# Patient Record
Sex: Male | Born: 1970 | Race: White | Hispanic: No | Marital: Married | Smoking: Never smoker
Health system: Southern US, Community
[De-identification: ages and names within clinical notes are randomized; demographics above are authoritative.]

## PROBLEM LIST (undated history)

## (undated) DIAGNOSIS — M51369 Other intervertebral disc degeneration, lumbar region without mention of lumbar back pain or lower extremity pain: Secondary | ICD-10-CM

## (undated) DIAGNOSIS — E785 Hyperlipidemia, unspecified: Secondary | ICD-10-CM

## (undated) DIAGNOSIS — M5136 Other intervertebral disc degeneration, lumbar region: Secondary | ICD-10-CM

## (undated) DIAGNOSIS — E119 Type 2 diabetes mellitus without complications: Secondary | ICD-10-CM

## (undated) DIAGNOSIS — F419 Anxiety disorder, unspecified: Secondary | ICD-10-CM

## (undated) DIAGNOSIS — G8929 Other chronic pain: Secondary | ICD-10-CM

## (undated) DIAGNOSIS — I10 Essential (primary) hypertension: Secondary | ICD-10-CM

## (undated) DIAGNOSIS — I739 Peripheral vascular disease, unspecified: Secondary | ICD-10-CM

## (undated) DIAGNOSIS — F32A Depression, unspecified: Secondary | ICD-10-CM

## (undated) HISTORY — PX: MEDIAL COLLATERAL LIGAMENT REPAIR, KNEE: SHX2019

## (undated) HISTORY — PX: HERNIA REPAIR: SHX51

## (undated) HISTORY — DX: Peripheral vascular disease, unspecified: I73.9

---

## 2017-12-21 ENCOUNTER — Other Ambulatory Visit: Payer: Self-pay | Admitting: Neurosurgery

## 2017-12-21 DIAGNOSIS — G8929 Other chronic pain: Secondary | ICD-10-CM

## 2017-12-21 DIAGNOSIS — M5442 Lumbago with sciatica, left side: Principal | ICD-10-CM

## 2017-12-27 ENCOUNTER — Ambulatory Visit
Admission: RE | Admit: 2017-12-27 | Discharge: 2017-12-27 | Disposition: A | Discharge status: Home / Self Care | Payer: Managed Care, Other (non HMO) | Source: Ambulatory Visit | Attending: Neurosurgery | Admitting: Neurosurgery

## 2017-12-27 DIAGNOSIS — M5442 Lumbago with sciatica, left side: Principal | ICD-10-CM

## 2017-12-27 DIAGNOSIS — G8929 Other chronic pain: Secondary | ICD-10-CM

## 2017-12-27 MED ORDER — IOPAMIDOL (ISOVUE-M 200) INJECTION 41%
1.0000 mL | Freq: Once | INTRAMUSCULAR | Status: AC
  Administered 2017-12-27: 1 mL via EPIDURAL

## 2017-12-27 MED ORDER — METHYLPREDNISOLONE ACETATE 40 MG/ML INJ SUSP (RADIOLOG
120.0000 mg | Freq: Once | INTRAMUSCULAR | Status: AC
  Administered 2017-12-27: 120 mg via EPIDURAL

## 2017-12-27 NOTE — Discharge Instructions (Signed)

## 2018-07-12 ENCOUNTER — Other Ambulatory Visit: Payer: Self-pay | Admitting: Physician Assistant

## 2018-07-12 DIAGNOSIS — M5442 Lumbago with sciatica, left side: Secondary | ICD-10-CM

## 2018-07-27 ENCOUNTER — Ambulatory Visit
Admission: RE | Admit: 2018-07-27 | Discharge: 2018-07-27 | Disposition: A | Discharge status: Home / Self Care | Payer: Managed Care, Other (non HMO) | Source: Ambulatory Visit | Attending: Physician Assistant | Admitting: Physician Assistant

## 2018-07-27 DIAGNOSIS — M5442 Lumbago with sciatica, left side: Secondary | ICD-10-CM

## 2018-07-27 MED ORDER — METHYLPREDNISOLONE ACETATE 40 MG/ML INJ SUSP (RADIOLOG
120.0000 mg | Freq: Once | INTRAMUSCULAR | Status: AC
  Administered 2018-07-27: 120 mg via EPIDURAL

## 2018-07-27 MED ORDER — IOPAMIDOL (ISOVUE-M 200) INJECTION 41%
1.0000 mL | Freq: Once | INTRAMUSCULAR | Status: AC
  Administered 2018-07-27: 1 mL via EPIDURAL

## 2018-07-27 NOTE — Discharge Instructions (Signed)

## 2018-12-20 ENCOUNTER — Other Ambulatory Visit: Payer: Self-pay | Admitting: Physician Assistant

## 2018-12-20 DIAGNOSIS — M5442 Lumbago with sciatica, left side: Secondary | ICD-10-CM

## 2018-12-27 ENCOUNTER — Ambulatory Visit
Admission: RE | Admit: 2018-12-27 | Discharge: 2018-12-27 | Disposition: A | Discharge status: Home / Self Care | Payer: 59 | Source: Ambulatory Visit | Attending: Physician Assistant | Admitting: Physician Assistant

## 2018-12-27 DIAGNOSIS — M5442 Lumbago with sciatica, left side: Secondary | ICD-10-CM

## 2018-12-27 MED ORDER — IOPAMIDOL (ISOVUE-M 200) INJECTION 41%
1.0000 mL | Freq: Once | INTRAMUSCULAR | Status: AC
  Administered 2018-12-27: 1 mL via EPIDURAL

## 2018-12-27 MED ORDER — METHYLPREDNISOLONE ACETATE 40 MG/ML INJ SUSP (RADIOLOG
120.0000 mg | Freq: Once | INTRAMUSCULAR | Status: AC
  Administered 2018-12-27: 120 mg via EPIDURAL

## 2019-10-20 IMAGING — XA Imaging study
1 series · 1 of 1 positions shown · non-contrast
Comparison: none

CLINICAL DATA: Left low back pain. Lumbosacral spondylosis without
myelopathy. Significant relief after the previous injection, without
side effect or complication. Partial recurrence of symptoms. The
patient wishes to repeat.

[Series 1: vasc standard · 1 of 1 slices shown]
[im 1/1]
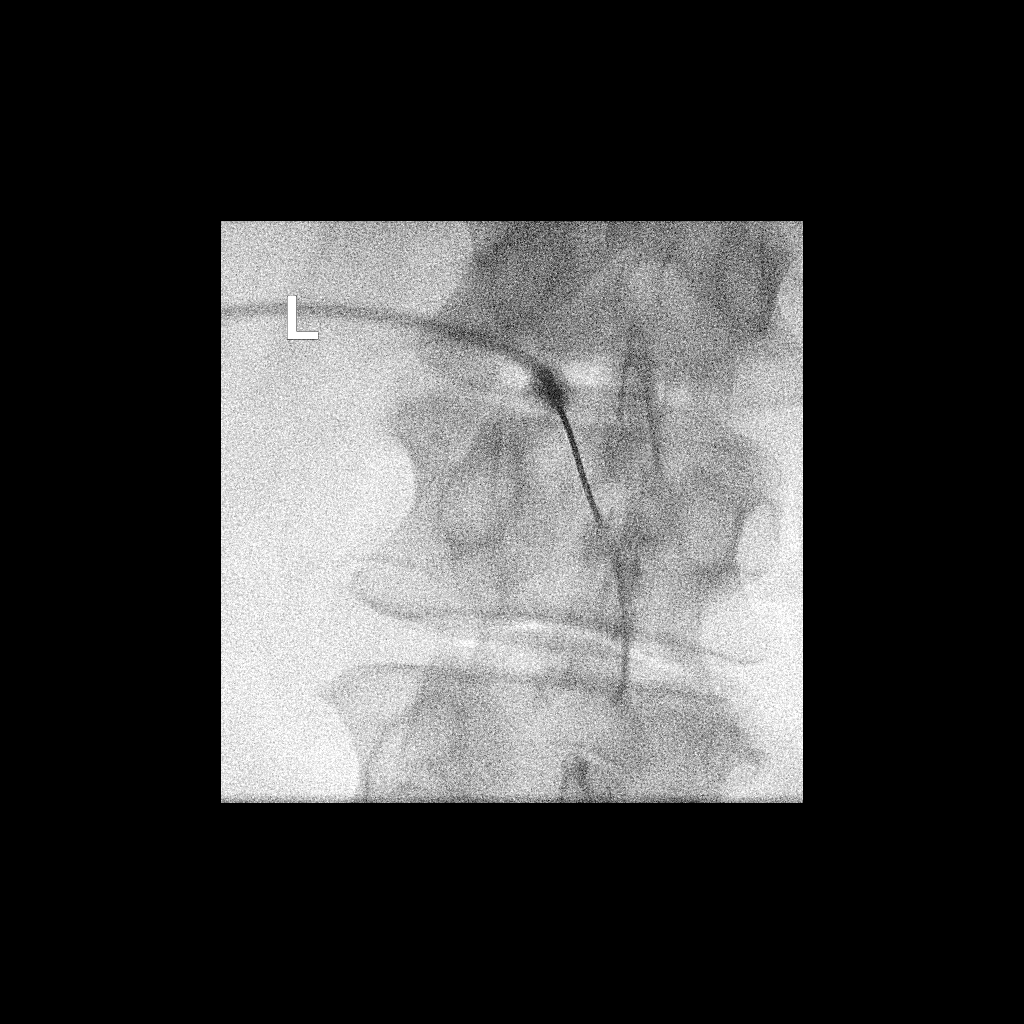

[1 of 1 positions shown; findings below may reference images not displayed]

EXAM:
LUMBAR EPIDURAL INJECTION:

DIAGNOSTIC EPIDURAL INJECTION:

THERAPEUTIC EPIDURAL INJECTION:

PROCEDURE:
The procedure, risks, benefits, and alternatives were explained to
the patient. Questions regarding the procedure were encouraged and
answered. The patient understands and consents to the procedure.

An interlaminar approach was performed on left at at L2-3. The
overlying skin was cleansed and anesthetized. A 20 gauge epidural
needle was advanced using loss-of-resistance technique.

Injection of Isovue-M 200 shows a good epidural pattern with spread
above and below the level of needle placement, primarily on the left
no vascular opacification is seen.

120mg of Depo-Medrol mixed with 2ml lidocaine 1% were instilled. The
procedure was well-tolerated, and the patient was discharged thirty
minutes following the injection in good condition.

FLUOROSCOPY TIME:  61 seconds; 88 uLymI DAP

COMPLICATIONS:
None immediate
IMPRESSION: Technically successful epidural injection # 2 on the left at at
L2-3.

## 2021-03-22 ENCOUNTER — Emergency Department (HOSPITAL_BASED_OUTPATIENT_CLINIC_OR_DEPARTMENT_OTHER)
Admission: EM | Admit: 2021-03-22 | Discharge: 2021-03-22 | Disposition: A | Discharge status: Home / Self Care | Payer: BC Managed Care – PPO | Attending: Emergency Medicine | Admitting: Emergency Medicine

## 2021-03-22 ENCOUNTER — Encounter (HOSPITAL_BASED_OUTPATIENT_CLINIC_OR_DEPARTMENT_OTHER): Payer: Self-pay | Admitting: *Deleted

## 2021-03-22 ENCOUNTER — Emergency Department (HOSPITAL_BASED_OUTPATIENT_CLINIC_OR_DEPARTMENT_OTHER): Payer: BC Managed Care – PPO

## 2021-03-22 ENCOUNTER — Other Ambulatory Visit: Payer: Self-pay

## 2021-03-22 DIAGNOSIS — I10 Essential (primary) hypertension: Secondary | ICD-10-CM | POA: Insufficient documentation

## 2021-03-22 DIAGNOSIS — R000 Tachycardia, unspecified: Secondary | ICD-10-CM | POA: Diagnosis not present

## 2021-03-22 DIAGNOSIS — R1032 Left lower quadrant pain: Secondary | ICD-10-CM | POA: Insufficient documentation

## 2021-03-22 DIAGNOSIS — D72829 Elevated white blood cell count, unspecified: Secondary | ICD-10-CM | POA: Diagnosis not present

## 2021-03-22 DIAGNOSIS — E1165 Type 2 diabetes mellitus with hyperglycemia: Secondary | ICD-10-CM | POA: Diagnosis not present

## 2021-03-22 DIAGNOSIS — K5900 Constipation, unspecified: Secondary | ICD-10-CM | POA: Insufficient documentation

## 2021-03-22 DIAGNOSIS — R739 Hyperglycemia, unspecified: Secondary | ICD-10-CM

## 2021-03-22 HISTORY — DX: Depression, unspecified: F32.A

## 2021-03-22 HISTORY — DX: Anxiety disorder, unspecified: F41.9

## 2021-03-22 HISTORY — DX: Type 2 diabetes mellitus without complications: E11.9

## 2021-03-22 HISTORY — DX: Other chronic pain: G89.29

## 2021-03-22 HISTORY — DX: Essential (primary) hypertension: I10

## 2021-03-22 HISTORY — DX: Hyperlipidemia, unspecified: E78.5

## 2021-03-22 LAB — COMPREHENSIVE METABOLIC PANEL
ALT: 18 U/L (ref 0–44)
AST: 16 U/L (ref 15–41)
Albumin: 3.8 g/dL (ref 3.5–5.0)
Alkaline Phosphatase: 93 U/L (ref 38–126)
Anion gap: 9 (ref 5–15)
BUN: 17 mg/dL (ref 6–20)
CO2: 26 mmol/L (ref 22–32)
Calcium: 9.3 mg/dL (ref 8.9–10.3)
Chloride: 93 mmol/L — ABNORMAL LOW (ref 98–111)
Creatinine, Ser: 0.63 mg/dL (ref 0.61–1.24)
GFR, Estimated: >60 mL/min (ref >60)
Glucose, Bld: 388 mg/dL — ABNORMAL HIGH (ref 70–99)
Potassium: 3.8 mmol/L (ref 3.5–5.1)
Sodium: 128 mmol/L — ABNORMAL LOW (ref 135–145)
Total Bilirubin: 0.5 mg/dL (ref 0.3–1.2)
Total Protein: 7.2 g/dL (ref 6.5–8.1)

## 2021-03-22 LAB — CBC WITH DIFFERENTIAL/PLATELET
Abs Immature Granulocytes: 0.05 10*3/uL (ref 0.00–0.07)
Basophils Absolute: 0.1 10*3/uL (ref 0.0–0.1)
Basophils Relative: 1 %
Eosinophils Absolute: 0.4 10*3/uL (ref 0.0–0.5)
Eosinophils Relative: 3 %
HCT: 46.3 % (ref 39.0–52.0)
Hemoglobin: 16 g/dL (ref 13.0–17.0)
Immature Granulocytes: 0 %
Lymphocytes Relative: 20 %
Lymphs Abs: 2.7 10*3/uL (ref 0.7–4.0)
MCH: 27.8 pg (ref 26.0–34.0)
MCHC: 34.6 g/dL (ref 30.0–36.0)
MCV: 80.5 fL (ref 80.0–100.0)
Monocytes Absolute: 1.2 10*3/uL — ABNORMAL HIGH (ref 0.1–1.0)
Monocytes Relative: 9 %
Neutro Abs: 9.3 10*3/uL — ABNORMAL HIGH (ref 1.7–7.7)
Neutrophils Relative %: 67 %
Platelets: 395 10*3/uL (ref 150–400)
RBC: 5.75 MIL/uL (ref 4.22–5.81)
RDW: 11.9 % (ref 11.5–15.5)
WBC: 13.7 10*3/uL — ABNORMAL HIGH (ref 4.0–10.5)
nRBC: 0 % (ref 0.0–0.2)

## 2021-03-22 LAB — URINALYSIS, ROUTINE W REFLEX MICROSCOPIC
Bilirubin Urine: NEGATIVE
Glucose, UA: >=500 mg/dL — AB
Hgb urine dipstick: NEGATIVE
Ketones, ur: NEGATIVE mg/dL
Leukocytes,Ua: NEGATIVE
Nitrite: NEGATIVE
Protein, ur: NEGATIVE mg/dL
Specific Gravity, Urine: 1.015 (ref 1.005–1.030)
pH: 5 (ref 5.0–8.0)

## 2021-03-22 LAB — URINALYSIS, MICROSCOPIC (REFLEX): Bacteria, UA: NONE SEEN

## 2021-03-22 LAB — LIPASE, BLOOD: Lipase: 63 U/L — ABNORMAL HIGH (ref 11–51)

## 2021-03-22 MED ORDER — SENNOSIDES-DOCUSATE SODIUM 8.6-50 MG PO TABS: 1.0000 | ORAL_TABLET | Freq: Every evening | ORAL | 0 refills | Status: DC | PRN

## 2021-03-22 MED ORDER — DICYCLOMINE HCL 10 MG PO CAPS
10.0000 mg | ORAL_CAPSULE | Freq: Once | ORAL | Status: AC
  Administered 2021-03-22: 10 mg via ORAL
  Filled 2021-03-22: qty 1

## 2021-03-22 MED ORDER — DICYCLOMINE HCL 20 MG PO TABS: 20.0000 mg | ORAL_TABLET | Freq: Three times a day (TID) | ORAL | 0 refills | Status: DC | PRN

## 2021-03-22 NOTE — ED Provider Notes (Signed)
Emergency Department Provider Note   I have reviewed the triage vital signs and the nursing notes.   HISTORY  Chief Complaint Abdominal Pain   HPI Larry Hammond is a 50 y.o. male with past medical history reviewed below including diabetes presents to the emergency department left abdomen and flank pain over the past 3 weeks.  He denies fevers but has felt hot at times.  He has some intermittent constipation but no diarrhea.  Has been feeling nauseated with no vomiting.  Denies any pain radiating into the chest or back.  He has not seen an associated rash.  He was seen at [facility] facility 3 days ago and had blood work along with a noncontrast CT scan of the abdomen and pelvis.  Lab work showed a mild leukocytosis with elevated blood sugar and CT scan showed nonobstructing kidney stone on the left but no ureterolithiasis.  No diverticulitis.   Patient's continued to have pain symptoms which have not changed but not improved.  He has been having a lot of gas.  No blood in the bowel movements.  No rectal pain.  No testicular pain, dysuria, hesitancy, urgency.  Past Medical History:  Diagnosis Date  . Anxiety   . Chronic back pain   . Depression   . Diabetes mellitus without complication (HCC)   . Hyperlipemia   . Hypertension     There are no problems to display for this patient.   Past Surgical History:  Procedure Laterality Date  . HERNIA REPAIR    . MEDIAL COLLATERAL LIGAMENT REPAIR, KNEE      Allergies No known allergies  No family history on file.  Social History Social History   Vaping Use  . Vaping Use: Never used  Substance Use Topics  . Alcohol use: Yes  . Drug use: Not Currently    Review of Systems  Constitutional: No fever/chills Eyes: No visual changes. ENT: No sore throat. Cardiovascular: Denies chest pain. Respiratory: Denies shortness of breath. Gastrointestinal: Positive left sided abdominal pain. Positive nausea, no vomiting.  No diarrhea.  Intermittent constipation. Genitourinary: Negative for dysuria. Musculoskeletal: Negative for back pain. Skin: Negative for rash. Neurological: Negative for headaches, focal weakness or numbness.  10-point ROS otherwise negative.  ____________________________________________   PHYSICAL EXAM:  VITAL SIGNS: ED Triage Vitals  Enc Vitals Group     BP 03/22/21 1518 (!) 183/103     Pulse Rate 03/22/21 1518 (!) 109     Resp 03/22/21 1518 16     Temp 03/22/21 1518 98.4 F (36.9 C)     Temp Source 03/22/21 1518 Oral     SpO2 03/22/21 1518 99 %     Weight 03/22/21 1515 266 lb (120.7 kg)     Height 03/22/21 1515 6\' 1"  (1.854 m)   Constitutional: Alert and oriented. Well appearing and in no acute distress. Eyes: Conjunctivae are normal.  Head: Atraumatic. Nose: No congestion/rhinnorhea. Mouth/Throat: Mucous membranes are moist.  Neck: No stridor.   Cardiovascular: Mild tachycardia. Good peripheral circulation. Grossly normal heart sounds.   Respiratory: Normal respiratory effort.  No retractions. Lungs CTAB. Gastrointestinal: Soft with tenderness in the LLQ and left flank. No overlying skin changes. No rebound or guarding. No distention.  Musculoskeletal: No lower extremity tenderness nor edema. No gross deformities of extremities. Neurologic:  Normal speech and language. Skin:  Skin is warm, dry and intact. No rash noted.   ____________________________________________   LABS (all labs ordered are listed, but only abnormal results are displayed)  Labs Reviewed  COMPREHENSIVE METABOLIC PANEL - Abnormal; Notable for the following components:      Result Value   Sodium 128 (*)    Chloride 93 (*)    Glucose, Bld 388 (*)    All other components within normal limits  LIPASE, BLOOD - Abnormal; Notable for the following components:   Lipase 63 (*)    All other components within normal limits  CBC WITH DIFFERENTIAL/PLATELET - Abnormal; Notable for the following components:   WBC  13.7 (*)    Neutro Abs 9.3 (*)    Monocytes Absolute 1.2 (*)    All other components within normal limits  URINALYSIS, ROUTINE W REFLEX MICROSCOPIC - Abnormal; Notable for the following components:   Glucose, UA >=500 (*)    All other components within normal limits  URINE CULTURE  URINALYSIS, MICROSCOPIC (REFLEX)   ____________________________________________  RADIOLOGY  CT ABDOMEN PELVIS WO CONTRAST  Result Date: 03/22/2021 CLINICAL DATA:  LEFT lower quadrant pain for 3 weeks, suspected diverticulitis EXAM: CT ABDOMEN AND PELVIS WITHOUT CONTRAST TECHNIQUE: Multidetector CT imaging of the abdomen and pelvis was performed following the standard protocol without IV contrast. Sagittal and coronal MPR images reconstructed from axial data set. No oral contrast administered. COMPARISON:  03/19/2021 FINDINGS: Lower chest: Lung bases clear Hepatobiliary: Gallbladder and liver normal appearance Pancreas: Normal appearance Spleen: Normal appearance Adrenals/Urinary Tract: Tiny nonobstructing RIGHT renal calculus. Adrenal glands, kidneys, ureters, and bladder otherwise normal appearance Stomach/Bowel: Normal appendix. Stomach and bowel loops normal appearance. No evidence of colonic diverticulosis or diverticulitis. Vascular/Lymphatic: Aorta normal caliber. Minimal atherosclerotic calcification of the iliac arteries. No adenopathy. Reproductive: Unremarkable prostate gland and seminal vesicles Other: Umbilical and supraumbilical hernias containing fat. No free air or free fluid. No inflammatory process. Musculoskeletal: Degenerative disc disease changes lumbar spine. IMPRESSION: Tiny nonobstructing RIGHT renal calculus. Umbilical and supraumbilical hernias containing fat. No acute intra-abdominal or intrapelvic abnormalities. Electronically Signed   By: Ulyses Southward M.D.   On: 03/22/2021 18:00    ____________________________________________   PROCEDURES  Procedure(s) performed:   Procedures  None   ____________________________________________   INITIAL IMPRESSION / ASSESSMENT AND PLAN / ED COURSE  Pertinent labs & imaging results that were available during my care of the patient were reviewed by me and considered in my medical decision making (see chart for details).   Patient presents with continued left-sided abdominal pain.  He has mild tenderness on exam.  I reviewed the lab work and CT imaging in care everywhere from 3 days ago.  No clear finding on CT to explain the patient's symptoms.  He does have tenderness on my exam here along with some tachycardia on arrival but no fever.  Tachycardia has improved with rest in the room and on my exam is no longer tachycardic.  Doubt sepsis.  He does not have a rash that would make me think this is shingles.  Diverticulitis is a consideration but no findings or stranding seen on CT to suggest this.  He is also not having diarrhea.  Plan for repeat blood work, UA, reassess.   Patient's lab work is consistent with hyperglycemia but no evidence of DKA.  He has a pseudohyponatremia.  UA does not have blood or sign of infection.  We will send this for culture.  The patient's white count is 13.7 up from 12 3 days ago.  Given this, we decided to move forward with additional CT imaging.  This was reviewed with no acute findings once again.  He is  having some constipation and gas pains.  Will provide Bentyl and increase his constipation medication.  He will establish care with a GI doctor.  Provided contact information for one at discharge as they may want to perform a colonoscopy given his age but also with his symptoms. ____________________________________________  FINAL CLINICAL IMPRESSION(S) / ED DIAGNOSES  Final diagnoses:  Left lower quadrant abdominal pain  Hyperglycemia     MEDICATIONS GIVEN DURING THIS VISIT:  Medications  dicyclomine (BENTYL) capsule 10 mg (has no administration in time range)     NEW OUTPATIENT MEDICATIONS STARTED  DURING THIS VISIT:  New Prescriptions   DICYCLOMINE (BENTYL) 20 MG TABLET    Take 1 tablet (20 mg total) by mouth 3 (three) times daily as needed for spasms.   SENNA-DOCUSATE (SENOKOT-S) 8.6-50 MG TABLET    Take 1 tablet by mouth at bedtime as needed for mild constipation or moderate constipation.    Note:  This document was prepared using Dragon voice recognition software and may include unintentional dictation errors.  Alona Bene, MD, [facility]    Alessander Sikorski, Arlyss Repress, MD 03/22/21 [id number]

## 2021-03-22 NOTE — ED Notes (Signed)
Patient transported to CT 

## 2021-03-22 NOTE — ED Triage Notes (Addendum)
C/o left lower abd pain x 3 weeks, CT scan  And labs done with results in epic  x 3 days ago

## 2021-03-22 NOTE — Discharge Instructions (Signed)
You were seen in the emergency room today with continued abdominal pain.  I am prescribing a medication called Bentyl to help with your symptoms.  I would also like for you to continue taking MiraLAX and start the other constipation medications as this may help your discomfort.  This time for you to see a gastroenterologist as well based on her age but given your symptoms they may consider doing a colonoscopy.  Please call the office tomorrow to schedule a follow-up appointment.   Return to the emergency department any new or suddenly worsening symptoms.

## 2021-03-24 LAB — URINE CULTURE: Culture: <10000 — AB

## 2021-07-30 ENCOUNTER — Encounter (HOSPITAL_BASED_OUTPATIENT_CLINIC_OR_DEPARTMENT_OTHER): Payer: Self-pay | Admitting: *Deleted

## 2021-07-30 ENCOUNTER — Emergency Department (HOSPITAL_BASED_OUTPATIENT_CLINIC_OR_DEPARTMENT_OTHER)
Admission: EM | Admit: 2021-07-30 | Discharge: 2021-07-31 | Disposition: A | Discharge status: Home / Self Care | Payer: BC Managed Care – PPO | Attending: Emergency Medicine | Admitting: Emergency Medicine

## 2021-07-30 ENCOUNTER — Emergency Department (HOSPITAL_BASED_OUTPATIENT_CLINIC_OR_DEPARTMENT_OTHER): Payer: BC Managed Care – PPO

## 2021-07-30 ENCOUNTER — Other Ambulatory Visit: Payer: Self-pay

## 2021-07-30 DIAGNOSIS — Z7984 Long term (current) use of oral hypoglycemic drugs: Secondary | ICD-10-CM | POA: Insufficient documentation

## 2021-07-30 DIAGNOSIS — Z23 Encounter for immunization: Secondary | ICD-10-CM | POA: Insufficient documentation

## 2021-07-30 DIAGNOSIS — S51812A Laceration without foreign body of left forearm, initial encounter: Secondary | ICD-10-CM | POA: Diagnosis not present

## 2021-07-30 DIAGNOSIS — W540XXA Bitten by dog, initial encounter: Secondary | ICD-10-CM | POA: Diagnosis not present

## 2021-07-30 DIAGNOSIS — E119 Type 2 diabetes mellitus without complications: Secondary | ICD-10-CM | POA: Diagnosis not present

## 2021-07-30 DIAGNOSIS — I10 Essential (primary) hypertension: Secondary | ICD-10-CM | POA: Diagnosis not present

## 2021-07-30 DIAGNOSIS — S81812A Laceration without foreign body, left lower leg, initial encounter: Secondary | ICD-10-CM | POA: Insufficient documentation

## 2021-07-30 MED ORDER — LIDOCAINE-EPINEPHRINE (PF) 2 %-1:200000 IJ SOLN
20.0000 mL | Freq: Once | INTRAMUSCULAR | Status: AC
  Administered 2021-07-30: 20 mL
  Filled 2021-07-30: qty 20

## 2021-07-30 MED ORDER — OXYCODONE-ACETAMINOPHEN 5-325 MG PO TABS
1.0000 | ORAL_TABLET | Freq: Once | ORAL | Status: AC
  Administered 2021-07-30: 1 via ORAL
  Filled 2021-07-30: qty 1

## 2021-07-30 MED ORDER — TETANUS-DIPHTH-ACELL PERTUSSIS 5-2.5-18.5 LF-MCG/0.5 IM SUSY
0.5000 mL | PREFILLED_SYRINGE | Freq: Once | INTRAMUSCULAR | Status: AC
  Administered 2021-07-30: 0.5 mL via INTRAMUSCULAR
  Filled 2021-07-30: qty 0.5

## 2021-07-30 NOTE — ED Triage Notes (Signed)
Pt states that he was laughing when his dog suddenly lunged at him and bit him several times on left arm and leg.  Pt has some bite marks.  Last tetanus unknown and dog is up to date on rabies vaccine.

## 2021-07-30 NOTE — Discharge Instructions (Addendum)
You were seen in the ED today for dog bite. We sutured your arms and legs. We also obtained and x-ray of your arm which showed no acute fractures or dislocations. Please return to ED in 7-10 days for suture removal. Please return sooner if signs or symptoms of infection of wounds. We updated your tetanus shot here. I have prescribed you Augmentin which is an antibiotic that you will take twice daily for the next 7 days. I have attached instructions for suture care over the next 7-10 days.

## 2021-07-30 NOTE — ED Provider Notes (Signed)
[facility] EMERGENCY DEPARTMENT Provider Note   CSN: [id number] Arrival date & time: 07/30/21  2211     History Chief Complaint  Patient presents with   Animal Bite    Larry Hammond is a 50 y.o. male.  Who presents emergency department after dog bite at home.  States that they were at home tonight watching TV when the dog suddenly bit patient on the left arm.  States that he did thrash around before letting go.  He then bit his left leg.  States that the dog is a 85 pound bulldog in lab mix.  States that he is up-to-date on his rabies vaccines.  Patient with unknown last tetanus.  Not anticoagulated.   Animal Bite     Past Medical History:  Diagnosis Date   Anxiety    Chronic back pain    Depression    Diabetes mellitus without complication (HCC)    Hyperlipemia    Hypertension     There are no problems to display for this patient.   Past Surgical History:  Procedure Laterality Date   HERNIA REPAIR     MEDIAL COLLATERAL LIGAMENT REPAIR, KNEE         No family history on file.  Social History   Vaping Use   Vaping Use: Never used  Substance Use Topics   Alcohol use: Yes   Drug use: Not Currently    Home Medications Prior to Admission medications   Medication Sig Start Date End Date Taking? Authorizing Provider  buPROPion (WELLBUTRIN XL) 150 MG 24 hr tablet Take 150 mg by mouth daily.    [provider]  buPROPion (WELLBUTRIN XL) 300 MG 24 hr tablet Take 300 mg by mouth daily.    [provider]  carvedilol (COREG) 3.125 MG tablet Take 3.125 mg by mouth 2 (two) times daily with a meal.    [provider]  diclofenac (VOLTAREN) 75 MG EC tablet Take 75 mg by mouth 2 (two) times daily.    [provider]  dicyclomine (BENTYL) 20 MG tablet Take 1 tablet (20 mg total) by mouth 3 (three) times daily as needed for spasms. 03/22/21   Long, Arlyss Repress, MD  DULoxetine (CYMBALTA) 60 MG capsule Take 60 mg by mouth daily.     [provider]  lisinopril-hydrochlorothiazide (ZESTORETIC) 20-25 MG tablet Take 1 tablet by mouth daily.    [provider]  metFORMIN (GLUCOPHAGE) 1000 MG tablet Take 1,000 mg by mouth 2 (two) times daily with a meal.    [provider]  senna-docusate (SENOKOT-S) 8.6-50 MG tablet Take 1 tablet by mouth at bedtime as needed for mild constipation or moderate constipation. 03/22/21   Long, Arlyss Repress, MD  simvastatin (ZOCOR) 80 MG tablet Take 80 mg by mouth daily.    [provider]    Allergies    No known allergies  Review of Systems   Review of Systems  Skin:  Positive for wound.  All other systems reviewed and are negative.  Physical Exam Updated Vital Signs BP (!) 168/110 (BP Location: Right Arm)   Pulse (!) 111   Temp 99.4 F (37.4 C) (Oral)   Resp 16   Wt 120.7 kg   SpO2 100%   BMI 35.09 kg/m   Physical Exam Vitals and nursing note reviewed.  Constitutional:      General: He is not in acute distress.    Appearance: Normal appearance.  HENT:     Head: Normocephalic and  atraumatic.  Eyes:     General: No scleral icterus. Pulmonary:     Effort: Pulmonary effort is normal. No respiratory distress.  Musculoskeletal:        General: Swelling and signs of injury present.  Skin:    General: Skin is warm and dry.     Capillary Refill: Capillary refill takes less than 2 seconds.     Findings: Laceration and wound present. No rash.          Comments: Multiple bite marks to the left forearm with swelling.  Pulses intact, sensation intact, range of motion limited due to pain.  Left lower extremity below the knee with multiple bite marks.  Pulses intact, range of motion intact, sensation intact.  Neurological:     General: No focal deficit present.     Mental Status: He is alert and oriented to person, place, and time. Mental status is at baseline.  Psychiatric:        Mood and Affect: Mood normal.        Behavior: Behavior normal.         Thought Content: Thought content normal.        Judgment: Judgment normal.    ED Results / Procedures / Treatments   Labs (all labs ordered are listed, but only abnormal results are displayed) Labs Reviewed - No data to display  EKG None  Radiology DG Forearm Left  Result Date: 07/30/2021 CLINICAL DATA:  Dog bite with swelling. EXAM: LEFT FOREARM - 2 VIEW COMPARISON:  None. FINDINGS: There is soft tissue swelling over the posterior aspect of the forearm. There is no radiopaque foreign body. There is no acute fracture or dislocation. IMPRESSION: 1. No acute bony abnormality. 2. Posterior soft tissue swelling.  No foreign body. Electronically Signed   By: Darliss Cheney M.D.   On: 07/30/2021 23:09    Procedures .Marland KitchenLaceration Repair  Date/Time: 07/31/2021 12:27 AM Performed by: Cristopher Peru, PA-C Authorized by: Cristopher Peru, PA-C   Consent:    Consent obtained:  Verbal   Consent given by:  Patient   Risks, benefits, and alternatives were discussed: yes     Risks discussed:  Infection, pain, need for additional repair, poor cosmetic result and poor wound healing   Alternatives discussed:  No treatment Universal protocol:    Procedure explained and questions answered to patient or proxy's satisfaction: yes     Relevant documents present and verified: yes     Test results available: yes     Imaging studies available: yes     Required blood products, implants, devices, and special equipment available: yes     Site/side marked: yes     Immediately prior to procedure, a time out was called: yes     Patient identity confirmed:  Verbally with patient Anesthesia:    Anesthesia method:  Local infiltration   Local anesthetic:  Lidocaine 2% WITH epi Laceration details:    Location:  Leg   Leg location:  L lower leg   Length (cm):  1   Depth (mm):  2 Pre-procedure details:    Preparation:  Patient was prepped and draped in usual sterile fashion Exploration:    Limited defect  created (wound extended): no     Hemostasis achieved with:  Epinephrine and direct pressure   Wound exploration: wound explored through full range of motion and entire depth of wound visualized     Wound extent: areolar tissue violated     Contaminated: yes  Treatment:    Area cleansed with:  Povidone-iodine and saline   Amount of cleaning:  Standard   Irrigation solution:  Sterile saline   Irrigation method:  Tap   Debridement:  Minimal   Undermining:  None   Scar revision: no   Skin repair:    Repair method:  Sutures   Suture size:  5-0   Suture material:  Prolene   Suture technique:  Simple interrupted   Number of sutures:  2 Approximation:    Approximation:  Close Repair type:    Repair type:  Simple Post-procedure details:    Dressing:  Open (no dressing)   Procedure completion:  Tolerated well, no immediate complications .Marland KitchenLaceration Repair  Date/Time: 07/31/2021 12:29 AM Performed by: Cristopher Peru, PA-C Authorized by: Cristopher Peru, PA-C   Consent:    Consent obtained:  Verbal   Consent given by:  Patient   Risks, benefits, and alternatives were discussed: yes     Risks discussed:  Infection, pain, need for additional repair, poor cosmetic result and poor wound healing   Alternatives discussed:  No treatment Universal protocol:    Procedure explained and questions answered to patient or proxy's satisfaction: yes     Relevant documents present and verified: yes     Test results available: yes     Imaging studies available: yes     Required blood products, implants, devices, and special equipment available: yes     Site/side marked: yes     Immediately prior to procedure, a time out was called: yes     Patient identity confirmed:  Verbally with patient Anesthesia:    Anesthesia method:  None Laceration details:    Location:  Leg   Leg location:  L lower leg   Length (cm):  0.5   Depth (mm):  1 Pre-procedure details:    Preparation:  Patient was prepped  and draped in usual sterile fashion Exploration:    Hemostasis achieved with:  Direct pressure   Wound exploration: wound explored through full range of motion and entire depth of wound visualized     Wound extent: areolar tissue violated     Contaminated: yes   Treatment:    Area cleansed with:  Povidone-iodine and saline   Amount of cleaning:  Standard   Irrigation solution:  Sterile saline   Irrigation method:  Tap   Debridement:  Minimal   Undermining:  None   Scar revision: no   Skin repair:    Repair method:  Steri-Strips   Number of Steri-Strips:  1 Approximation:    Approximation:  Loose Repair type:    Repair type:  Simple Post-procedure details:    Dressing:  Open (no dressing)   Procedure completion:  Tolerated well, no immediate complications .Marland KitchenLaceration Repair  Date/Time: 07/31/2021 12:30 AM Performed by: Cristopher Peru, PA-C Authorized by: Cristopher Peru, PA-C   Consent:    Consent obtained:  Verbal   Consent given by:  Patient   Risks, benefits, and alternatives were discussed: yes     Risks discussed:  Infection, pain, poor wound healing, poor cosmetic result and need for additional repair   Alternatives discussed:  No treatment Universal protocol:    Procedure explained and questions answered to patient or proxy's satisfaction: yes     Relevant documents present and verified: yes     Test results available: yes     Imaging studies available: yes     Required blood products, implants, devices, and  special equipment available: yes     Site/side marked: yes     Immediately prior to procedure, a time out was called: yes     Patient identity confirmed:  Verbally with patient Anesthesia:    Anesthesia method:  Local infiltration   Local anesthetic:  Lidocaine 2% WITH epi Laceration details:    Location:  Shoulder/arm   Shoulder/arm location:  L lower arm   Length (cm):  0.5   Depth (mm):  4 Pre-procedure details:    Preparation:  Patient was prepped  and draped in usual sterile fashion and imaging obtained to evaluate for foreign bodies Exploration:    Hemostasis achieved with:  Direct pressure and epinephrine   Imaging obtained: x-ray     Imaging outcome: foreign body not noted     Wound exploration: wound explored through full range of motion and entire depth of wound visualized     Wound extent: areolar tissue violated     Contaminated: yes   Treatment:    Area cleansed with:  Povidone-iodine and saline   Amount of cleaning:  Standard   Irrigation solution:  Sterile saline   Irrigation method:  Tap   Debridement:  Minimal   Undermining:  None Skin repair:    Repair method:  Sutures   Suture size:  5-0   Suture material:  Prolene   Suture technique:  Simple interrupted   Number of sutures:  1 Approximation:    Approximation:  Close Repair type:    Repair type:  Simple Post-procedure details:    Dressing:  Open (no dressing)   Procedure completion:  Tolerated well, no immediate complications .Marland KitchenLaceration Repair  Date/Time: 07/31/2021 12:32 AM Performed by: Cristopher Peru, PA-C Authorized by: Cristopher Peru, PA-C   Consent:    Consent obtained:  Verbal   Consent given by:  Patient   Risks, benefits, and alternatives were discussed: yes     Risks discussed:  Infection, pain, poor cosmetic result, poor wound healing and need for additional repair   Alternatives discussed:  No treatment Universal protocol:    Procedure explained and questions answered to patient or proxy's satisfaction: yes     Relevant documents present and verified: yes     Test results available: yes     Imaging studies available: yes     Required blood products, implants, devices, and special equipment available: yes     Site/side marked: yes     Immediately prior to procedure, a time out was called: yes     Patient identity confirmed:  Verbally with patient Anesthesia:    Anesthesia method:  None Laceration details:    Location:   Shoulder/arm   Shoulder/arm location:  L lower arm   Length (cm):  1   Depth (mm):  1 Pre-procedure details:    Preparation:  Patient was prepped and draped in usual sterile fashion and imaging obtained to evaluate for foreign bodies Exploration:    Hemostasis achieved with:  Direct pressure   Imaging obtained: x-ray     Imaging outcome: foreign body not noted     Wound exploration: wound explored through full range of motion and entire depth of wound visualized     Wound extent: areolar tissue violated     Contaminated: yes   Treatment:    Area cleansed with:  Povidone-iodine and saline   Amount of cleaning:  Standard   Irrigation solution:  Sterile saline   Irrigation method:  Tap   Debridement:  None  Undermining:  None Skin repair:    Repair method:  Steri-Strips   Number of Steri-Strips:  3 Approximation:    Approximation:  Loose Repair type:    Repair type:  Simple Post-procedure details:    Dressing:  Open (no dressing)   Procedure completion:  Tolerated well, no immediate complications .Marland KitchenLaceration Repair  Date/Time: 07/31/2021 12:34 AM Performed by: Cristopher Peru, PA-C Authorized by: Cristopher Peru, PA-C   Consent:    Consent obtained:  Verbal   Consent given by:  Patient   Risks, benefits, and alternatives were discussed: yes     Risks discussed:  Infection, pain, poor cosmetic result, retained foreign body, poor wound healing and need for additional repair   Alternatives discussed:  No treatment Universal protocol:    Procedure explained and questions answered to patient or proxy's satisfaction: yes     Relevant documents present and verified: yes     Test results available: yes     Imaging studies available: yes     Required blood products, implants, devices, and special equipment available: yes     Site/side marked: yes     Immediately prior to procedure, a time out was called: yes     Patient identity confirmed:  Verbally with patient Anesthesia:     Anesthesia method:  Local infiltration   Local anesthetic:  Lidocaine 2% WITH epi Laceration details:    Location:  Leg   Leg location:  L lower leg   Length (cm):  1.5   Depth (mm):  3 Pre-procedure details:    Preparation:  Patient was prepped and draped in usual sterile fashion Exploration:    Hemostasis achieved with:  Direct pressure   Wound exploration: wound explored through full range of motion and entire depth of wound visualized     Contaminated: yes   Treatment:    Area cleansed with:  Povidone-iodine and saline   Amount of cleaning:  Standard   Irrigation solution:  Sterile saline   Irrigation method:  Tap   Debridement:  Minimal   Undermining:  None Skin repair:    Repair method:  Sutures   Suture size:  5-0   Suture material:  Prolene   Suture technique:  Simple interrupted   Number of sutures:  2 Approximation:    Approximation:  Close Repair type:    Repair type:  Simple Post-procedure details:    Dressing:  Open (no dressing)   Procedure completion:  Tolerated well, no immediate complications .Marland KitchenLaceration Repair  Date/Time: 07/31/2021 12:36 AM Performed by: Cristopher Peru, PA-C Authorized by: Cristopher Peru, PA-C   Consent:    Consent obtained:  Verbal   Consent given by:  Patient   Risks, benefits, and alternatives were discussed: yes     Risks discussed:  Infection, pain, retained foreign body, poor cosmetic result, poor wound healing and need for additional repair   Alternatives discussed:  No treatment Universal protocol:    Procedure explained and questions answered to patient or proxy's satisfaction: yes     Relevant documents present and verified: yes     Test results available: yes     Imaging studies available: yes     Required blood products, implants, devices, and special equipment available: yes     Site/side marked: yes     Immediately prior to procedure, a time out was called: yes     Patient identity confirmed:  Verbally with  patient Anesthesia:    Anesthesia method:  Local  infiltration   Local anesthetic:  Lidocaine 2% WITH epi Laceration details:    Location:  Leg   Leg location:  L lower leg   Length (cm):  0.5   Depth (mm):  3 Pre-procedure details:    Preparation:  Patient was prepped and draped in usual sterile fashion Exploration:    Limited defect created (wound extended): no     Hemostasis achieved with:  Epinephrine   Wound exploration: wound explored through full range of motion and entire depth of wound visualized     Contaminated: yes   Treatment:    Area cleansed with:  Povidone-iodine and saline   Amount of cleaning:  Standard   Irrigation solution:  Sterile saline   Irrigation method:  Tap   Debridement:  Minimal   Undermining:  None   Scar revision: no   Skin repair:    Repair method:  Sutures   Suture size:  5-0   Suture material:  Prolene   Suture technique:  Simple interrupted   Number of sutures:  1 Approximation:    Approximation:  Close Repair type:    Repair type:  Simple Post-procedure details:    Dressing:  Open (no dressing)   Procedure completion:  Tolerated well, no immediate complications .Marland KitchenLaceration Repair  Date/Time: 07/31/2021 12:38 AM Performed by: Cristopher Peru, PA-C Authorized by: Cristopher Peru, PA-C   Consent:    Consent obtained:  Verbal   Consent given by:  Patient   Risks, benefits, and alternatives were discussed: yes     Risks discussed:  Infection, pain, retained foreign body, poor cosmetic result, need for additional repair and poor wound healing   Alternatives discussed:  No treatment Universal protocol:    Procedure explained and questions answered to patient or proxy's satisfaction: yes     Relevant documents present and verified: yes     Test results available: yes     Imaging studies available: yes     Required blood products, implants, devices, and special equipment available: yes     Site/side marked: yes     Immediately prior  to procedure, a time out was called: yes     Patient identity confirmed:  Verbally with patient Anesthesia:    Anesthesia method:  Local infiltration   Local anesthetic:  Lidocaine 2% WITH epi Laceration details:    Location:  Leg   Leg location:  L lower leg   Length (cm):  1.5   Depth (mm):  5 Pre-procedure details:    Preparation:  Patient was prepped and draped in usual sterile fashion Exploration:    Limited defect created (wound extended): no     Hemostasis achieved with:  Epinephrine   Wound exploration: wound explored through full range of motion and entire depth of wound visualized     Wound extent: areolar tissue violated     Contaminated: yes   Treatment:    Area cleansed with:  Povidone-iodine and saline   Amount of cleaning:  Standard   Irrigation solution:  Sterile saline   Irrigation method:  Tap   Debridement:  Minimal   Undermining:  None   Scar revision: no   Skin repair:    Repair method:  Sutures   Suture size:  5-0   Suture material:  Prolene   Suture technique:  Simple interrupted   Number of sutures:  3 Approximation:    Approximation:  Close Repair type:    Repair type:  Simple Post-procedure details:  Dressing:  Open (no dressing)   Procedure completion:  Tolerated well, no immediate complications   Medications Ordered in ED Medications  lidocaine-EPINEPHrine (XYLOCAINE W/EPI) 2 %-1:200000 (PF) injection 20 mL (20 mLs Infiltration Given 07/30/21 2312)  Tdap (BOOSTRIX) injection 0.5 mL (0.5 mLs Intramuscular Given 07/30/21 2312)  oxyCODONE-acetaminophen (PERCOCET/ROXICET) 5-325 MG per tablet 1 tablet (1 tablet Oral Given 07/30/21 2312)    ED Course  I have reviewed the triage vital signs and the nursing notes.  Pertinent labs & imaging results that were available during my care of the patient were reviewed by me and considered in my medical decision making (see chart for details).    MDM Rules/Calculators/A&P 49yoM who presents to the  emergency department after dog bite.   Xray of left forearm obtained due to swelling and pain. No acute fractures or dislocations.  See above procedures for multiple laceration repairs.  Tetanus updated. Prescribed Augmentin 875/125 BID x7 days.  Instructed to return to the emergency department in next 7-10 days for suture removal. Return sooner for signs and symptoms of infection, which I have reviewed with the patient. He understands discharge teaching. He may use tylenol and ibuprofen for pain relief at home.  Provided suture care instructions. Vitals stable. Safe for discharge at this time.  Final Clinical Impression(s) / ED Diagnoses Final diagnoses:  Dog bite, initial encounter    Rx / DC Orders ED Discharge Orders          Ordered    amoxicillin-clavulanate (AUGMENTIN) 875-125 MG tablet  2 times daily        07/31/21 0041             Cristopher Peru, PA-C 07/31/21 6962    Vanetta Mulders, MD 08/02/21 1334

## 2021-07-31 MED ORDER — AMOXICILLIN-POT CLAVULANATE 875-125 MG PO TABS
1.0000 | ORAL_TABLET | Freq: Once | ORAL | Status: AC
  Administered 2021-07-31: 1 via ORAL

## 2021-07-31 MED ORDER — AMOXICILLIN-POT CLAVULANATE 875-125 MG PO TABS: 1.0000 | ORAL_TABLET | Freq: Two times a day (BID) | ORAL | 0 refills | Status: AC

## 2021-08-09 ENCOUNTER — Encounter (HOSPITAL_BASED_OUTPATIENT_CLINIC_OR_DEPARTMENT_OTHER): Payer: Self-pay | Admitting: Emergency Medicine

## 2021-08-09 ENCOUNTER — Other Ambulatory Visit: Payer: Self-pay

## 2021-08-09 ENCOUNTER — Emergency Department (HOSPITAL_BASED_OUTPATIENT_CLINIC_OR_DEPARTMENT_OTHER)
Admission: EM | Admit: 2021-08-09 | Discharge: 2021-08-09 | Disposition: A | Discharge status: Home / Self Care | Payer: BC Managed Care – PPO | Attending: Student | Admitting: Student

## 2021-08-09 DIAGNOSIS — I10 Essential (primary) hypertension: Secondary | ICD-10-CM | POA: Diagnosis not present

## 2021-08-09 DIAGNOSIS — Z79899 Other long term (current) drug therapy: Secondary | ICD-10-CM | POA: Insufficient documentation

## 2021-08-09 DIAGNOSIS — S41112D Laceration without foreign body of left upper arm, subsequent encounter: Secondary | ICD-10-CM | POA: Diagnosis not present

## 2021-08-09 DIAGNOSIS — E1169 Type 2 diabetes mellitus with other specified complication: Secondary | ICD-10-CM | POA: Diagnosis not present

## 2021-08-09 DIAGNOSIS — W540XXD Bitten by dog, subsequent encounter: Secondary | ICD-10-CM | POA: Diagnosis not present

## 2021-08-09 DIAGNOSIS — E785 Hyperlipidemia, unspecified: Secondary | ICD-10-CM | POA: Diagnosis not present

## 2021-08-09 DIAGNOSIS — Z4802 Encounter for removal of sutures: Secondary | ICD-10-CM | POA: Diagnosis not present

## 2021-08-09 DIAGNOSIS — Z7984 Long term (current) use of oral hypoglycemic drugs: Secondary | ICD-10-CM | POA: Diagnosis not present

## 2021-08-09 DIAGNOSIS — S81812D Laceration without foreign body, left lower leg, subsequent encounter: Secondary | ICD-10-CM | POA: Insufficient documentation

## 2021-08-09 HISTORY — DX: Other intervertebral disc degeneration, lumbar region without mention of lumbar back pain or lower extremity pain: M51.369

## 2021-08-09 HISTORY — DX: Other intervertebral disc degeneration, lumbar region: M51.36

## 2021-08-09 NOTE — ED Provider Notes (Signed)
[facility] EMERGENCY DEPARTMENT Provider Note   CSN: [id number] Arrival date & time: 08/09/21  7425     History Chief Complaint  Patient presents with   Suture / Staple Removal    Larry Hammond is a 50 y.o. male who presents emergency department for evaluation of suture removal.  Patient recently suffered multiple lacerations after being bit by his pet bulldog.  He has completed most of his antibiotics and states that he may have 1 or 2 pills left.  He has had no nausea, vomiting, fevers or any erythema or pus draining from the wounds.  Multiple lacerations repaired at previous visit with a total suture count of 9.  Suture / Staple Removal Pertinent negatives include no chest pain, no abdominal pain and no shortness of breath.      Past Medical History:  Diagnosis Date   Anxiety    Chronic back pain    DDD (degenerative disc disease), lumbar    Depression    Diabetes mellitus without complication (HCC)    Hyperlipemia    Hypertension     There are no problems to display for this patient.   Past Surgical History:  Procedure Laterality Date   HERNIA REPAIR     MEDIAL COLLATERAL LIGAMENT REPAIR, KNEE         No family history on file.  Social History   Vaping Use   Vaping Use: Never used  Substance Use Topics   Alcohol use: Yes   Drug use: Not Currently    Home Medications Prior to Admission medications   Medication Sig Start Date End Date Taking? Authorizing Provider  buPROPion (WELLBUTRIN XL) 150 MG 24 hr tablet Take 150 mg by mouth daily.    [provider]  buPROPion (WELLBUTRIN XL) 300 MG 24 hr tablet Take 300 mg by mouth daily.    [provider]  carvedilol (COREG) 3.125 MG tablet Take 3.125 mg by mouth 2 (two) times daily with a meal.    [provider]  diclofenac (VOLTAREN) 75 MG EC tablet Take 75 mg by mouth 2 (two) times daily.    [provider]  dicyclomine (BENTYL) 20 MG tablet Take 1 tablet (20 mg  total) by mouth 3 (three) times daily as needed for spasms. 03/22/21   Long, Arlyss Repress, MD  DULoxetine (CYMBALTA) 60 MG capsule Take 60 mg by mouth daily.    [provider]  lisinopril-hydrochlorothiazide (ZESTORETIC) 20-25 MG tablet Take 1 tablet by mouth daily.    [provider]  metFORMIN (GLUCOPHAGE) 1000 MG tablet Take 1,000 mg by mouth 2 (two) times daily with a meal.    [provider]  senna-docusate (SENOKOT-S) 8.6-50 MG tablet Take 1 tablet by mouth at bedtime as needed for mild constipation or moderate constipation. 03/22/21   Long, Arlyss Repress, MD  simvastatin (ZOCOR) 80 MG tablet Take 80 mg by mouth daily.    [provider]    Allergies    No known allergies  Review of Systems   Review of Systems  Constitutional:  Negative for chills and fever.  HENT:  Negative for ear pain and sore throat.   Eyes:  Negative for pain and visual disturbance.  Respiratory:  Negative for cough and shortness of breath.   Cardiovascular:  Negative for chest pain and palpitations.  Gastrointestinal:  Negative for abdominal pain and vomiting.  Genitourinary:  Negative for dysuria and hematuria.  Musculoskeletal:  Negative for arthralgias and back pain.  Skin:  Negative for color change and rash.  Neurological:  Negative for seizures and syncope.  All other systems reviewed and are negative.  Physical Exam Updated Vital Signs Pulse 86   Temp 98.4 F (36.9 C) (Oral)   Resp 16   Ht 5\' 11"  (1.803 m)   Wt 120.7 kg   SpO2 100%   BMI 37.10 kg/m   Physical Exam Vitals and nursing note reviewed.  Constitutional:      Appearance: He is well-developed.  HENT:     Head: Normocephalic and atraumatic.  Eyes:     Conjunctiva/sclera: Conjunctivae normal.  Cardiovascular:     Rate and Rhythm: Normal rate and regular rhythm.     Heart sounds: No murmur heard. Pulmonary:     Effort: Pulmonary effort is normal. No respiratory distress.     Breath sounds: Normal  breath sounds.  Abdominal:     Palpations: Abdomen is soft.     Tenderness: There is no abdominal tenderness.  Musculoskeletal:     Cervical back: Neck supple.  Skin:    General: Skin is warm and dry.  Neurological:     Mental Status: He is alert.    ED Results / Procedures / Treatments   Labs (all labs ordered are listed, but only abnormal results are displayed) Labs Reviewed - No data to display  EKG None  Radiology No results found.  Procedures .Suture Removal  Date/Time: 08/09/2021 8:09 AM Performed by: 10/09/2021, MD Authorized by: Glendora Score, MD   Location:    Location:  Upper extremity   Upper extremity location:  Arm   Arm location:  L upper arm Procedure details:    Wound appearance:  No signs of infection and clean   Number of sutures removed:  1 Post-procedure details:    Post-removal:  No dressing applied   Procedure completion:  Tolerated well, no immediate complications .Suture Removal  Date/Time: 08/09/2021 8:10 AM Performed by: 10/09/2021, MD Authorized by: Glendora Score, MD   Location:    Location:  Lower extremity   Lower extremity location:  Leg   Leg location:  L lower leg Procedure details:    Wound appearance:  No signs of infection and clean   Number of sutures removed:  8 Post-procedure details:    Post-removal:  No dressing applied   Procedure completion:  Tolerated well, no immediate complications   Medications Ordered in ED Medications - No data to display  ED Course  I have reviewed the triage vital signs and the nursing notes.  Pertinent labs & imaging results that were available during my care of the patient were reviewed by me and considered in my medical decision making (see chart for details).    MDM Rules/Calculators/A&P                           Patient seen emergency department for evaluation of suture removal.  Physical exam reveals multiple well-healing lacerations with no evidence of  surrounding cellulitis or purulent drainage.  No tenderness out of proportion surrounding the sites.  1 suture was removed from the left upper forearm and 8 total sutures removed from the left lower extremity for a matching suture count of 9.  Patient then discharged and instructed to complete all of his antibiotics. Final Clinical Impression(s) / ED Diagnoses Final diagnoses:  Visit for suture removal    Rx / DC Orders ED Discharge Orders     None  Glendora Score, MD 08/09/21 [phone]

## 2021-08-09 NOTE — ED Triage Notes (Signed)
Suture removal to left arm and leg.  Pt states he has not had any issues with the areas.

## 2022-10-23 IMAGING — DX DG FOREARM 2V*L*
2 series · 2 of 2 positions shown · non-contrast
Comparison: None.

CLINICAL DATA: Dog bite with swelling.

EXAM:
LEFT FOREARM - 2 VIEW

[forearm ap]
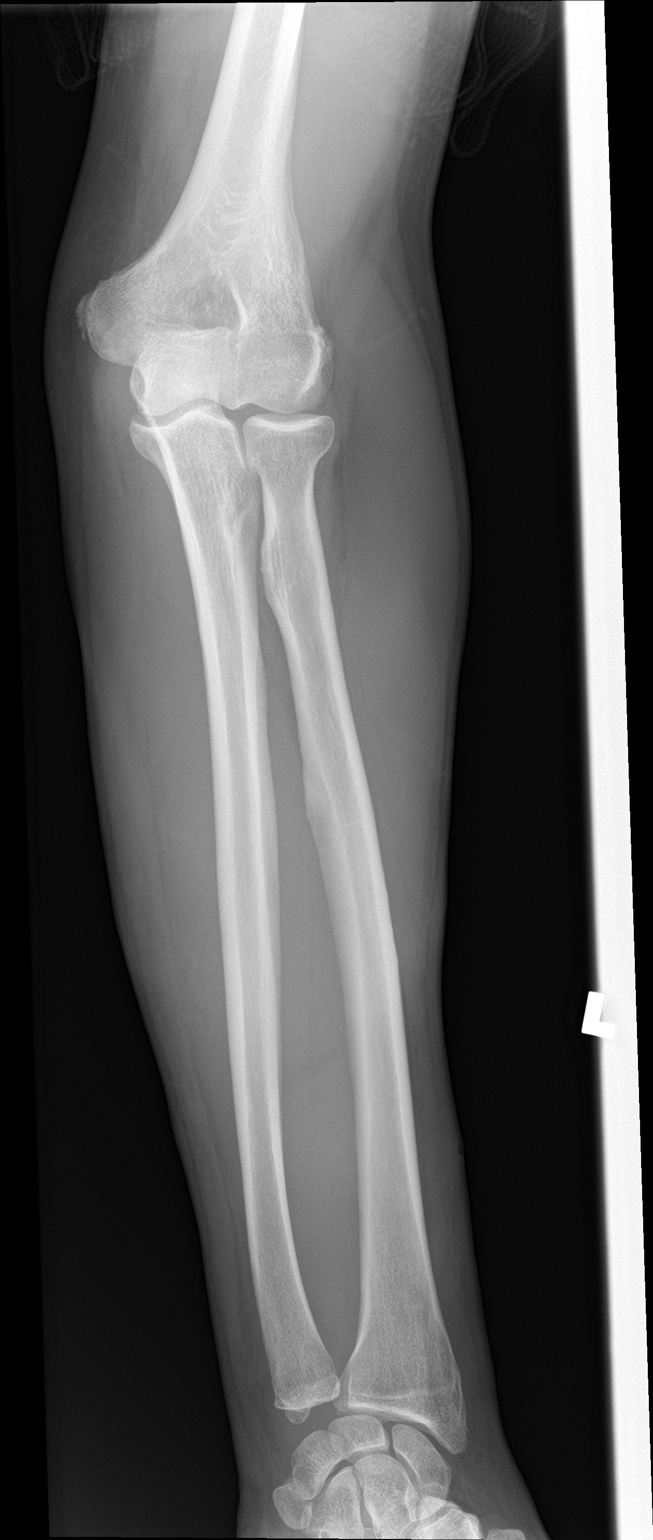

[forearm lat]
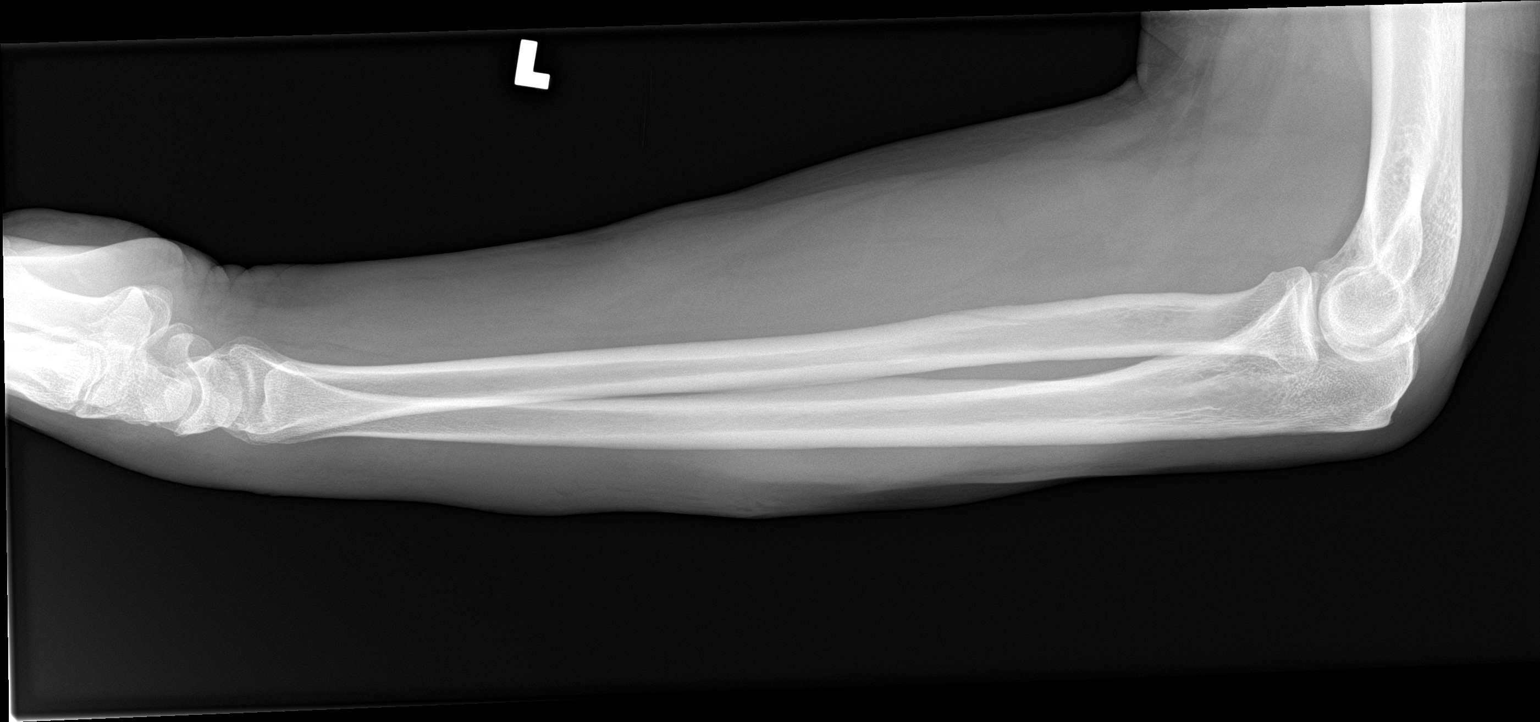

[2 of 2 positions shown; findings below may reference images not displayed]

FINDINGS: There is soft tissue swelling over the posterior aspect of the
forearm. There is no radiopaque foreign body. There is no acute
fracture or dislocation.
IMPRESSION: 1. No acute bony abnormality.
2. Posterior soft tissue swelling.  No foreign body.

## 2023-08-31 NOTE — Progress Notes (Signed)
 [facility]  - [facility] [place] Follow Up Visit Larry Hammond DOB: [birth year]  MRN: [id number]  Visit Date: 08/31/2023 Encounter Provider: Tinnie Almarie Forts, PA-C  PCP: Tinnie Almarie Forts, PA-C  Subjective:    Chief Complaint  Patient presents with  . Diabetes    Pt states he is NOT fasting and needs his meds refilled.   Hypertension Patient is here for follow-up of elevated blood pressure. He ran out of medicine a few weeks ago. Patient denies chest pain, dyspnea, exertional chest pressure/discomfort, palpitations, syncope and worsening edema.  BP Readings from Last 3 Encounters:  08/31/23 (!) 164/92  10/27/22 126/80  03/25/22 120/68   Hyperlipidemia Presents in follow up of his cholesterol. Current lipid lowering medications reviewed. Tolerating medication well. No side effects. No myalgias. Lipid panel is current. Lab Results  Component Value Date   CHOL 168 10/27/2022   TRIG 236 (H) 10/27/2022   HDL 39 10/27/2022   Diabetes Presents in follow up of his diabetes. Patient's most recent labs include: Lab Results  Component Value Date   HGBA1C 15.0 (H) 10/27/2022   HGBA1C 15.0 (H) 10/27/2022   HGBA1C 13.8 (H) 03/25/2022   Patient does not check home blood sugars.   Home blood sugars: patient does not check sugars Hypoglycemic episodes: No On ACE-I/ARB: Yes On statin therapy: Yes Sees opthalmology/optometry: No He has not been taking tresiba. When asked why he states I dropped the ball and just haven't been taking it. Patient is having worsening burning in both hands and feet. His spine doctor feels it is more neuropathy rather than a spine issue. Recommended he see a neurologist.  Pt reports no lesions on the feet currently.   The following portions of the patient's history were reviewed and updated as appropriate: Patient Active Problem List  Diagnosis  . Type 2 diabetes mellitus with hyperglycemia, with long-term  current use of insulin (HCC)  . Class 1 obesity due to excess calories with serious comorbidity and body mass index (BMI) of 32.0 to 32.9 in adult  . Hyperlipidemia LDL goal <100  . Hypertension  . Depression  . Spinal stenosis of lumbar region at multiple levels  . Neural foraminal stenosis of lumbar spine  . Thyroid nodule  . Anxiety  . Diabetic polyneuropathy (HCC)    Current Outpatient Medications on File Prior to Visit  Medication Sig Dispense Refill  . aspirin 81 mg EC tablet Take 81 mg by mouth Once Daily.    . diclofenac (VOLTAREN) 75 mg EC tablet Take 1 tablet (75 mg total) by mouth 2 (two) times a day. 60 tablet 5  . [DISCONTINUED] buPROPion (WELLBUTRIN XL) 300 mg 24 hr tablet Take 1 tablet (300 mg total) by mouth every morning Indications: depression. 30 tablet 5  . [DISCONTINUED] carvediloL (COREG) 3.125 mg tablet Take 1 tablet (3.125 mg total) by mouth in the morning and 1 tablet (3.125 mg total) in the evening. Take with meals. 60 tablet 5  . [DISCONTINUED] DULoxetine (CYMBALTA) 60 mg capsule Take 1 capsule (60 mg total) by mouth daily. 30 capsule 5  . [DISCONTINUED] lisinopriL-hydrochlorothiazide (PRINZIDE) 20-25 mg per tablet Take 1 tablet by mouth daily. 30 tablet 5  . [DISCONTINUED] metFORMIN (GLUCOPHAGE) 1,000 mg tablet Take 1 tablet (1,000 mg total) by mouth in the morning and 1 tablet (1,000 mg total) in the evening. Take with meals. 60 tablet 5  . [DISCONTINUED] simvastatin (ZOCOR) 80 mg tablet Take 1 tablet (80 mg total) by mouth  daily. 30 tablet 5  . glucose blood (Blood Glucose Test) test strip Use to check blood sugars up to two times daily. Diagnosis code: E11.9 (Patient not taking: Reported on 08/31/2023) 100 strip 5  . glucose monitoring kit kit Use as instructed to check blood sugars up to twice daily. Diagnosis code: E11.9 (Patient not taking: Reported on 08/31/2023) 1 each 1  . insulin degludec (TRESIBA FlexTouch) 100 unit/mL (3 mL) pen Inject 50 Units under  the skin 2 (two) times a day. (Patient not taking: Reported on 08/31/2023) 30 mL 5  . lancets 33 gauge misc Use to check blood sugars up to 2 times daily. Diagnosis code: E11.9 (Patient not taking: Reported on 08/31/2023) 100 each 5  . [DISCONTINUED] blood-glucose sensor (FreeStyle Libre 3 Sensor) devi Inject 1 sensor to the skin every 14 days for continuous glucose monitoring. 2 kit 11   No current facility-administered medications on file prior to visit.    He  has a past surgical history that includes Hernia repair and Knee cartilage surgery (05/2007). His family history includes Cirrhosis in his father. He has No Known Allergies..   Review of Systems - History obtained from the patient Complete ROS negative except those noted in the HPI or elsewhere in note  Objective  Objective    Physical Exam Vital signs and nursing note reviewed. BP (!) 164/92   Pulse 84   Temp 97.4 F (36.3 C) (Temporal)   Ht 1.854 m (6' 1)   Wt 113 kg (249 lb 6.4 oz)   SpO2 98%   BMI 32.90 kg/m    General appearance: alert, appears stated age and cooperative Head: Normocephalic and atrauamatic Eyes: Conjunctiva clear, EOMs intact, no scleral icterus noted Neck: no adenopathy, no carotid bruit, no JVD; neck is supple, symmetrical; trachea midline and thyroid not enlarged, symmetric, no tenderness/mass/nodules Heart: regular rate and rhythm, S1, S2 normal, no murmur, click, rub or gallop Lungs: clear to auscultation bilaterally; no wheezes, rhonchi, rales and in no acute distress. Breathing is unlabored. Extremities: extremities normal, atraumatic, no cyanosis or edema Pulses: 2+ and symmetric  Psych: Mood and affect appropriate Skin: color, texture, and turgor normal; no rashes or lesions   Recent lab work reviewed and analyzed: Lab Results  Component Value Date   WBC 13.8 (H) 04/15/2021   HGB 15.8 04/15/2021   HCT 46.4 04/15/2021   MCV 79.6 (L) 04/15/2021   PLT 381 04/15/2021   ALT 29  10/27/2022   AST 19 10/27/2022   CREATININE 0.91 10/27/2022   BUN 26 (H) 10/27/2022   NA 129 (L) 10/27/2022   K 4.6 10/27/2022   CL 89 (L) 10/27/2022   CO2 30 10/27/2022   HGBA1C 15.0 (H) 10/27/2022      Recent imaging reviewed and analyzed: CT ABDOMEN PELVIS WO CONTRAST (ROUTINE) CLINICAL DATA:  Acute left lower quadrant abdominal pain.  EXAM: CT ABDOMEN AND PELVIS WITHOUT CONTRAST  TECHNIQUE: Multidetector CT imaging of the abdomen and pelvis was performed following the standard protocol without IV contrast.  COMPARISON:  None.  FINDINGS: Lower chest: No acute abnormality.  Hepatobiliary: No gallstones or biliary dilatation is noted. Hepatic steatosis is noted.  Pancreas: Unremarkable. No pancreatic ductal dilatation or surrounding inflammatory changes.  Spleen: Normal in size without focal abnormality.  Adrenals/Urinary Tract: Adrenal glands appear normal. Small nonobstructive right renal calculus is noted. No hydronephrosis or renal obstruction is noted. Urinary bladder is unremarkable.  Stomach/Bowel: Stomach is within normal limits. Appendix appears normal. No evidence of  bowel wall thickening, distention, or inflammatory changes.  Vascular/Lymphatic: Aortic atherosclerosis. No enlarged abdominal or pelvic lymph nodes.  Reproductive: Prostate is unremarkable.  Other: Moderate size fat containing periumbilical hernia is noted. Moderate size fat containing supraumbilical hernia is noted as well. No ascites is noted.  Musculoskeletal: No acute or significant osseous findings.  IMPRESSION: Hepatic steatosis.  Small nonobstructive right renal calculus. No hydronephrosis or renal obstruction is noted.  Moderate sized fat containing supraumbilical and periumbilical hernias.  Electronically Signed   By: Lynwood Landy Raddle M.D.   On: 03/19/2021 14:28 US  GUIDED FINE NEEDLE ASPIRATION INDICATION: Indeterminate thyroid nodules  EXAM: ULTRASOUND GUIDED  FINE NEEDLE ASPIRATION OF INDETERMINATE THYROID NODULES  COMPARISON:  Ultrasound February 21, 2020  MEDICATIONS: 1% lidocaine  4 mL  COMPLICATIONS: None immediate.  TECHNIQUE: Informed written consent was obtained from the patient after a discussion of the risks, benefits and alternatives to treatment. Questions regarding the procedure were encouraged and answered. A timeout was performed prior to the initiation of the procedure.  Pre-procedural ultrasound scanning demonstrated unchanged size and appearance of the indeterminate nodules within the left lobe of the thyroid.  The procedure was planned. The neck was prepped in the usual sterile fashion, and a sterile drape was applied covering the operative field. A timeout was performed prior to the initiation of the procedure. Local anesthesia was provided with 1% lidocaine .  Under direct ultrasound guidance, 5 FNA biopsies were performed of the left mid thyroid nodule and 6 FNA biopsies were performed of the left inferior thyroid nodule with 25 gauge needles. Multiple ultrasound images were saved for procedural documentation purposes. The samples were prepared and submitted to pathology.  Limited post procedural scanning was negative for hematoma or additional complication. Dressings were placed. The patient tolerated the above procedures procedure well without immediate postprocedural complication.  FINDINGS: FINDINGS Nodule reference number based on prior diagnostic ultrasound: 2  Maximum size: 1.8 cm  Location: Left  ;  Mid  ACR TI-RADS risk category:  TR4  Reason for biopsy: meets ACR TI-RADS criteria  Nodule reference number based on prior diagnostic ultrasound: 3  Maximum size: 4.3 cm  Location: Left ;  Inferior  ACR TI-RADS risk category:  TR3  Reason for biopsy: meets ACR TI-RADS criteria  Ultrasound imaging confirms appropriate placement of the needles within the thyroid  nodules.  IMPRESSION: Technically successful ultrasound guided fine needle aspiration of left thyroid nodules.  Read by: Sari Lamp, PA-C  Electronically Signed   By: Juliene Balder M.D.   On: 02/27/2020 12:03 US  HEAD NECK SOFT TISSUE THYROID CLINICAL DATA:  Thyroid nodule  EXAM: THYROID ULTRASOUND  TECHNIQUE: Ultrasound examination of the thyroid gland and adjacent soft tissues was performed.  COMPARISON:  None.  FINDINGS: Parenchymal Echotexture: Moderately heterogenous  Isthmus: 6 mm  Right lobe: 6.0 x 1.5 x 2.6 cm  Left lobe: 7.9 x 3.5 x 3.5  _________________________________________________________  Estimated total number of nodules >/= 1 cm: 3  Number of spongiform nodules >/=  2 cm not described below (TR1): 0  Number of mixed cystic and solid nodules >/= 1.5 cm not described below (TR2): 0  _________________________________________________________  Nodule # 2:  Location: Left; Mid  Maximum size: 1.8 cm; Other 2 dimensions: 1.6 x 1.7 cm  Composition: solid/almost completely solid (2)  Echogenicity: isoechoic (1)  Shape: not taller-than-wide (0)  Margins: ill-defined (0)  Echogenic foci: peripheral calcifications (2)  ACR TI-RADS total points: 5.  ACR TI-RADS risk category: TR4 (4-6 points).  ACR TI-RADS recommendations:  Given size (>/= 1.5 cm) and appearance, fine needle aspiration of this moderately suspicious nodule should be considered based on TI-RADS criteria.  _________________________________________________________  Nodule # 3:  Location: Left; Inferior  Maximum size: 4.3 cm; Other 2 dimensions: 3.1 x 3.2 cm  Composition: solid/almost completely solid (2)  Echogenicity: isoechoic (1)  Shape: not taller-than-wide (0)  Margins: ill-defined (0)  Echogenic foci: none (0)  ACR TI-RADS total points: 3.  ACR TI-RADS risk category: TR3 (3 points).  ACR TI-RADS recommendations:  Given size (>/= 2.5 cm) and appearance,  fine needle aspiration of this mildly suspicious nodule should be considered based on TI-RADS criteria.  _________________________________________________________  There is an additional subcentimeter isthmus nodule measuring only 7 mm. This would not meet criteria for any biopsy or follow-up.  Also there is a dystrophic shadowing calcification in the left thyroid inferiorly measuring 1.2 cm.  No hypervascularity. No significant right thyroid abnormality. No regional adenopathy.  IMPRESSION: 1.8 cm left mid thyroid TR 4 nodule and 4.3 cm left inferior TR 3 nodule. Both meet criteria for biopsy as above.  The above is in keeping with the ACR TI-RADS recommendations - J Am Coll Radiol 2017;14:587-595.  Electronically Signed   By: CHRISTELLA.  Shick M.D.   On: 02/21/2020 13:30 MR SPINE LUMBAR WO CONTRAST CLINICAL DATA:  Chronic low back pain. Left leg pain, weakness and numbness for 1 week. The patient has suffered 4 falls in the past week, most recently 11/25/2017.  EXAM: MRI LUMBAR SPINE WITHOUT CONTRAST  TECHNIQUE: Multiplanar, multisequence MR imaging of the lumbar spine was performed. No intravenous contrast was administered.  COMPARISON:  None.  FINDINGS: Segmentation:  Standard.  Alignment:  There is convex right scoliosis.  Vertebrae: No fracture or worrisome lesion. Mild degenerative endplate signal change L3-4, L4-5 and L5-S1 noted.  Conus medullaris and cauda equina: Conus extends to the L1 level. Conus and cauda equina appear normal.  Paraspinal and other soft tissues: Negative.  Disc levels:  T12-L1 is imaged in the sagittal plane only and negative.  L1-2: Negative.  L2-3: The patient has a large broad-based disc bulge with a superimposed cephalad extending disc protrusion on the left. A large disc extrusion extends caudally approximately 2 cm below the disc interspace in the left subarticular recess. The disc compresses the descending left L3 root and  could also impact additional descending nerve roots. Centrally and to the right, there is a second caudally extending protrusion causing narrowing in the right subarticular recess and encroachment on the descending right L3 root. There is some ligamentum flavum thickening and facet arthropathy. Degenerative change results in moderately severe central canal stenosis.  L3-4: There is a disc bulge with a superimposed down turning central and slightly to eccentric to the left protrusion. Moderate central canal stenosis is present. There narrowing of both subarticular recesses and encroachment on the descending L4 roots. The foramina are open.  L4-5: Broad-based central disc protrusion demonstrates some caudal extension to the left. The disc causes narrowing of both subarticular recesses and could impact either descending L5 root. Encroachment is greater on the left L5 root. There is mild central canal stenosis overall at this level. Mild to moderate foraminal narrowing is worse on the left.  L5-S1: There is a broad-based central and eccentric to the left disc protrusion with caudal extension. Disc impinges on the descending left S1 root. Moderate bilateral foraminal narrowing is seen.  IMPRESSION: Large disc extrusion with caudal extension at L2-3 impinges on descending  nerve roots, particularly the left L3 root. A second disc protrusion with caudal extension on the right is also at this level impinges on the right L3 root.  Moderate central canal stenosis and narrowing in the subarticular recesses with encroachment on both descending L4 roots at L3-4.  Left worse than right encroachment on the descending L5 roots at L4-5 due to disc is worse on the left.  Caudal extension of the disc protrusion at L5-S1 impinges on the left S1 root.  Please see above for detailed descriptions of individual levels.  Electronically Signed   By: Debby Prader M.D.   On: 11/29/2017  10:48 ORTHO XR PELVIS 2 OR 3 VW UNILATERAL HIP Narrative: ORTHO XR PELVIS 2 OR 3 VW UNILATERAL HIP, 11/27/17  Order Questions Answers  Reason for exam: pain  Laterality: Left  View(s) AP Pelvis,AP Hip,Lateral Hip   FINDINGS: AP pelvis x-ray as well as frog-lateral left hip x-ray were  taken today and reviewed by myself.  X-rays show no acute fractures or  dislocations.  Mild degenerative changes noted in the left hip.  Mild  femoral acetabular impingement with cam impingement of the superior aspect  of the left femoral neck.  SI joints with degenerative changes noted  bilaterally.  No soft tissue abnormality is noted. Impression:  No acute osseous abnormalities.  Mild degenerative changes  noted in the left hip joint with possible cam type femoral acetabular  impingement. ORTHO XR L SPINE 2 OR 3 VW Narrative: ORTHO XR L SPINE 2 OR 3 VW, 11/22/17  Order Questions Answers  Reason for exam: pain  View(s): Lateral,AP   FINDINGS: 2 view x-rays of the lumbar spine were taken today and reviewed  by myself.  X-rays show no acute fractures or dislocations.  Diffuse  degenerative disc disease of the lumbar spine noted.  Discs height is  flattening between L2-3, L3-4, L4-5 and significant between L5-S1.   Anterior and posterior vertebral osteophytes noted.  Mild loss of normal  lumbar lordosis.  No anterolisthesis or retrolisthesis noted.  Facet  arthritic changes noted between these levels.  Minimal dextroconvex  scoliosis noted.  SI joints with degenerative changes noted bilaterally.   Hip joints with mild degenerative change noted bilaterally.  No soft  tissue normality is noted. Impression:  Degenerative disease of the lumbar spine       Assessment/Plan    1. Type 2 diabetes mellitus with hyperglycemia, with long-term current use of insulin (HCC) Had a frank discussion regarding his uncontrolled diabetes. He has a history of stopping/discontinuing his medication and not following  up for a year at a time. He feels he is finally ready to take ownership of his diabetes and work at getting his blood sugar under control. I expressed my concern regarding his severely uncontrolled diabetes and we reviewed again the possible side effects including heart attack, PAD, stroke, severe infection and worsening neuropathy.  - metFORMIN (GLUCOPHAGE) 1,000 mg tablet; Take 1 tablet (1,000 mg total) by mouth in the morning and 1 tablet (1,000 mg total) in the evening. Take with meals.  Dispense: 60 tablet; Refill: 5 - semaglutide (OZEMPIC) 0.25 mg or 0.5 mg (2 mg/3 mL) pen injector; Inject 0.25 mg under the skin every 7 days for 4 doses.  Dispense: 1.5 mL; Refill: 0 - Ambulatory referral to Endocrinology; Future - Hemoglobin A1C With Estimated Average Glucose; Future - Comprehensive Metabolic Panel; Future - Hemoglobin A1C With Estimated Average Glucose - Comprehensive Metabolic Panel  2. Diabetic polyneuropathy associated  with type 2 diabetes mellitus (CMD) See above. Increase cymbalta to 90 mg daily.  - Ambulatory referral to Neurology; Future - Comprehensive Metabolic Panel; Future - Comprehensive Metabolic Panel  3. Primary hypertension Well controlled <140/90 when he takes his medicine but he has been off of it for a few weeks now. Resume.  Continue current medications. Blood Pressure Target Goal: The goal for blood pressure is less than 140/90.   If you are checking your blood pressure at home, please record it and bring it to your next office visit. Following the Dietary Approaches to Stop Hypertension (DASH) diet (3 servings of fruit and vegetables daily, whole grains, low sodium, low-fat proteins) and exercising for 30 minutes 3-4 times per week is also generally recommended for those with high blood pressure. - lisinopriL-hydrochlorothiazide (PRINZIDE) 20-25 mg per tablet; Take 1 tablet by mouth daily.  Dispense: 30 tablet; Refill: 5 - carvediloL (COREG) 3.125 mg tablet; Take 1  tablet (3.125 mg total) by mouth in the morning and 1 tablet (3.125 mg total) in the evening. Take with meals.  Dispense: 60 tablet; Refill: 5 - Comprehensive Metabolic Panel; Future - TSH With Reflex To Free T4; Future - Comprehensive Metabolic Panel - TSH With Reflex To Free T4  4. Hyperlipidemia LDL goal <100 Tolerating medications well. Recheck labs. Continue current medication and will contact with any necessary dose changes based on results. - simvastatin (ZOCOR) 80 mg tablet; Take 1 tablet (80 mg total) by mouth daily.  Dispense: 30 tablet; Refill: 5 - Comprehensive Metabolic Panel; Future - Lipid Panel; Future - Comprehensive Metabolic Panel - Lipid Panel  5. Class 1 obesity due to excess calories with serious comorbidity and body mass index (BMI) of 32.0 to 32.9 in adult The patient is asked to make an attempt to improve diet and exercise patterns to aid in medical management of this problem.  6. Severe episode of recurrent major depressive disorder, without psychotic features (CMD) 7. Anxiety Increase cymbalta to 90 mg daily. No active SI; call [phone] or [phone] if this develops.  Return in 4 weeks to recheck.  - DULoxetine (CYMBALTA) 60 mg capsule; Take 1 capsule (60 mg total) by mouth daily.  Dispense: 30 capsule; Refill: 5 - buPROPion (WELLBUTRIN XL) 300 mg 24 hr tablet; Take 1 tablet (300 mg total) by mouth every morning Indications: depression.  Dispense: 30 tablet; Refill: 5 - DULoxetine (CYMBALTA) 30 mg capsule; Take 1 capsule (30 mg total) by mouth daily. Take with 60 mg for 90 mg total.  Dispense: 30 capsule; Refill: 5  8. Weakness of both lower extremities Likely related to known spinal issues or worsening neuropathy. ABI ordered to r/o vascular disease  - Ambulatory referral to Neurology; Future - US  ABI/WBI 1-2 Lvl; Future   Patient expresses understanding of their current medications and use.  If a new prescription was given today, then I discussed potential side  effects, drug interactions, instructions for taking the medication, and the consequences of not taking it.   Patient verbalized an understanding of these instructions. Patient's medical and personal goals were discussed today. Barriers to current goals: none evident The following portions of the patient's history were reviewed and updated as appropriate: allergies, current medications, past family history, past medical history, past social history, past surgical history and problem list. Return in about 4 weeks (around 09/28/2023) for DM, HTN, depression, anxiety. Call sooner if need arises.   Future Appointments  Date Time Provider Department Center  10/02/2023  1:00 PM Tinnie Norris  Evangelina, PA-C [facility] [address]     This document serves as a record of services personally performed by Tinnie Evangelina, PA-C.  It was created on their behalf by Ona LITTIE Mandril, CMA, a trained medical scribe, and Certified Medical Assistant (CMA). During the course of documenting the history, physical exam and medical decision making, I was functioning as a Stage manager. The creation of this record is the provider's dictation and/or activities during the visit.   Electronically signed by Ona LITTIE Mandril, CMA 08/31/2023 10:36 AM    I agree the documentation is accurate and complete.  Electronically signed by: Tinnie Almarie Evangelina, PA-C 08/31/2023 12:15 PM

## 2024-03-22 NOTE — Progress Notes (Signed)
 03/22/2024  Norleen Last  [id number]  Tinnie Almarie Forts, PA-C   Chief complaint:  Patient is here for the following Diabetic Neuropathy .  HPI: History of Present Illness The patient presents for evaluation of chronic back pain, peripheral neuropathy, and sleep apnea.  Chronic Back Pain and Peripheral Neuropathy He has experienced chronic back pain for several years, radiating down his legs. No nerve testing performed to date. Currently on duloxetine and pregabalin 100 mg, which he reports as beneficial. Experiences foot swelling towards the end of his work shift involving prolonged sitting. No side effects from medications. - Onset: Chronic back pain for several years. - Location: Back, radiating down his legs. - Character: Chronic pain, radiating down legs, foot swelling towards the end of work shift. - Alleviating/Aggravating Factors: Duloxetine and pregabalin are beneficial; prolonged sitting aggravates foot swelling. - Timing: Foot swelling towards the end of work shift.  Reports occasional awakenings at night due to leg twitching or sharp pains, including an instance of hand pain lasting 2 hours. Previously received epidural steroid injections, providing temporary relief for approximately 2 months. - Onset: Occasional awakenings at night. - Character: Leg twitching, sharp pains, hand pain lasting 2 hours. - Duration: Temporary relief from epidural steroid injections for approximately 2 months. - Timing: Occasional awakenings at night.  Sleep Apnea Diagnosed with sleep apnea and uses a CPAP machine, which he finds helpful. Reports frequent nighttime urination. - Onset: Diagnosed with sleep apnea. - Alleviating/Aggravating Factors: CPAP machine is helpful. - Timing: Frequent nighttime urination.  SOCIAL HISTORY Occupation: Personnel officer, Patent examiner and receiving tasks. Sleep: Uses CPAP machine for sleep apnea, improving sleep quality. Sometimes wakes up due to  bathroom needs or leg twitching/sharp pains.  MEDICATIONS CURRENT MEDS: Duloxetine Pregabalin 100 mg Ozempic 0.5 mg    Patient Active Problem List  Diagnosis  . Type 2 diabetes mellitus with hyperglycemia, with long-term current use of insulin (HCC)  . Class 1 obesity due to excess calories with serious comorbidity and body mass index (BMI) of 32.0 to 32.9 in adult  . Hyperlipidemia LDL goal <100  . Hypertension  . Depression  . Spinal stenosis of lumbar region at multiple levels  . Neural foraminal stenosis of lumbar spine  . Thyroid nodule  . Anxiety  . Diabetic polyneuropathy (HCC)  . Severe obstructive sleep apnea     Current Outpatient Medications:  .  aspirin 81 mg EC tablet, Take 81 mg by mouth Once Daily., Disp: , Rfl:  .  buPROPion (WELLBUTRIN XL) 300 mg 24 hr tablet, TAKE 1 TABLET (300 MG TOTAL) BY MOUTH EVERY MORNING INDICATIONS: DEPRESSION., Disp: 90 tablet, Rfl: 2 .  carvediloL (COREG) 3.125 mg tablet, TAKE 1 TABLET (3.125 MG TOTAL) BY MOUTH IN THE MORNING AND IN THE EVENING WITH MEALS, Disp: 180 tablet, Rfl: 2 .  diclofenac (VOLTAREN) 75 mg EC tablet, TAKE 1 TABLET BY MOUTH TWICE A DAY, Disp: 60 tablet, Rfl: 5 .  DULoxetine (CYMBALTA) 30 mg capsule, TAKE 1 CAPSULE (30 MG TOTAL) BY MOUTH DAILY. TAKE WITH 60 MG FOR 90 MG TOTAL., Disp: 90 capsule, Rfl: 2 .  DULoxetine (CYMBALTA) 60 mg capsule, Take 1 capsule (60 mg total) by mouth daily., Disp: 30 capsule, Rfl: 5 .  glucose blood (Blood Glucose Test) test strip, Use to check blood sugars up to two times daily. Diagnosis code: E11.9, Disp: 100 strip, Rfl: 5 .  glucose monitoring kit kit, Use as instructed to check blood sugars up to twice daily. Diagnosis code: E11.9,  Disp: 1 each, Rfl: 1 .  insulin degludec (TRESIBA FlexTouch) 100 unit/mL (3 mL) pen, Inject 50 Units under the skin 2 (two) times a day., Disp: 30 mL, Rfl: 5 .  lancets 33 gauge misc, Use to check blood sugars up to 2 times daily. Diagnosis code: E11.9, Disp:  100 each, Rfl: 5 .  lisinopriL-hydrochlorothiazide (PRINZIDE) 20-25 mg per tablet, Take 1 tablet by mouth daily., Disp: 30 tablet, Rfl: 5 .  metFORMIN (GLUCOPHAGE) 1,000 mg tablet, TAKE 1 TABLET (1000 MG TOTAL) BY MOUTH IN THE MORNING AND IN THE EVENING WITH MEALS, Disp: 180 tablet, Rfl: 1 .  rosuvastatin (CRESTOR) 20 mg tablet, Take 1 tablet (20 mg total) by mouth daily., Disp: 90 tablet, Rfl: 3 .  semaglutide (Ozempic) 0.25 mg or 0.5 mg (2 mg/3 mL) subcutaneous pen injector, INJECT 0.5MG  UNDER THE SKIN EVERY 7 DAYS, Disp: 3 mL, Rfl: 2 .  pregabalin (LYRICA) 150 mg capsule, Take 1 capsule (150 mg total) by mouth 2 (two) times a day., Disp: 60 capsule, Rfl: 5  Tobacco Use: Low Risk  (03/22/2024)   Patient History   . Smoking Tobacco Use: Never   . Smokeless Tobacco Use: Never   . Passive Exposure: Not on file    REVIEW OF SYSTEMS:   Review of Systems  Vitals:   03/22/24 1101  BP: 118/79  Pulse: 88  SpO2: 96%   There is no height or weight on file to calculate BMI.  Pain Assessment Pain Score  : 10 Pain Type: Chronic pain Pain Location: Back     PHYSICAL EXAM:   GEN: Well appearing, no apparent distress  NECK: supple  SKIN: Normal.  NEURO:  Alert and Oriented times three, Normal speech and language. Patient gives a coherent history and has a normal train of thought. Recent and remote memory appear to be normal.  Normal attention and concentration. CRANIAL NERVES:  Extraocular motions are intact no diplopia.  Normal facial symmetry and sensation.  Uvula and palate upward and midline.  Sensory normal to light touch throughout  Motor moves all extremities well, no abnormal movements. No evidence of tremor.  GAIT: Normal walking. Gait is normal not wide-based.   Assessment & Plan 1. Chronic back pain: Chronic. Likely due to peripheral neuropathy and spinal stenosis. Peripheral neuropathy likely secondary to diabetes, pending further testing. Spinal stenosis identified in  2019 MRI causing nerve pressure. - Increase pregabalin to 150 mg for one month - If ineffective or significant side effects, consider Qutenza patches or spinal cord stimulator - If 150 mg ineffective, increase to 200 mg - Repeat spine imaging if consulting a surgeon  2. Peripheral neuropathy: Chronic. Characterized by numbness and burning/stinging pain. Currently on duloxetine and pregabalin 100 mg. - Increase pregabalin to 150 mg for one month - If ineffective or significant side effects, consider Qutenza patches or spinal cord stimulator - If 150 mg ineffective, increase to 200 mg  3. Sleep apnea: Severe. Sleep study revealed severe apnea, AHI 52.6. Using CPAP machine with pressure setting of 8, improving sleep quality. - Informed about Zepbound, which decreases fat in airway and tongue, aiding nighttime breathing  Follow-up - Follow-up in about a month to assess effectiveness of increased pregabalin dosage   Diagnoses and all orders for this visit:  Diabetic polyneuropathy associated with type 2 diabetes mellitus    (CMD)  Neural foraminal stenosis of lumbar spine  Spinal stenosis of lumbar region at multiple levels  Severe obstructive sleep apnea  Other orders -  pregabalin (LYRICA) 150 mg capsule; Take 1 capsule (150 mg total) by mouth 2 (two) times a day.      Call or go to ER if any acute changes.  Safety and side effects discussed. All question answered.    I agree the documentation is accurate and complete.  Electronically signed by: Camellia ONEIDA Custard, MD 03/22/2024 11:23 AM

## 2024-03-26 NOTE — Progress Notes (Signed)
 Adult Endocrinology Clinic Note   Referring Provider: Evangelina Tinnie Brier*  SUBJECTIVE:  This is a 53 y.o. male who presents for an initial consultation visit for diabetes mellitus type 2 ,last A1c 12.2% (12/22/23).  He was previously seen by Dr. Elizbeth Blanch, last visit was in late 2021.  Today's BG is 267 mg/dl, J8R is 88.8%  Diabetes History: Diagnosed in 2010 . Started treatment with Metformin . Started insulin in 2010, shortly after diagnosis   A1c: Lab Results  Component Value Date   HGBA1C 12.2 (H) 12/22/2023    Current Medication Regimen:  Tresiba 50 units BID Metformin 1000 mg BID Semaglutide 0.5 mg weekly 2 months at this dose Lisinopril  rosuvastatin  Previous therapies: Novolog 70/30 Adherence: misses occasionally  Blood Glucose Monitoring:  Does not check BG  Hypoglycemia:  None  Meal Plan: Patient eats 1 at lunch meals per day. Has a child who is a picky eater Breakfast: occasionally: cereal mini wheats or rice krispies or eggs Lunch: leftovers or lunchmeat-limited bread  Dinner: weekends: Beef, chicken, potatoes, rice, mac-n-cheese Snacks: popcorn Beverages tea, sweet; limited diet sodas  Exercise: Limited due to mobility issues  Health Maintenance/Complications: Microvascular Complications:  Retinopathy: Last eye exam 03/14/2024  Nephropathy:  >90   Neuropathy: yes Macrovascular Complications: Denies hx MI, CVA, +++PAD  Gastroparesis: none ARB: Yes Statin: Yes Lab Results  Component Value Date   LDLCALC 88 12/22/2023    Review of Systems:  Complete ROS obtained and pertinent positives noted in the HPI  PMH, PSH, Social Hx, Family Hx, Meds and Allergies  I have independently reviewed the patient's past medical history, pastsurgical history, medication list, and social history noted below.   Past Medical History:  Diagnosis Date  . Depression 06/21/2012  . Dyslipidemia 06/21/2012  . Hypertension 06/21/2012  . Morbid obesity with BMI of  40.0-44.9, adult (CMD) 10/28/2014  . Type 2 diabetes mellitus with complication    (CMD) 08/02/2013   Past Surgical History:  Procedure Laterality Date  . HERNIA REPAIR     Procedure: HERNIA REPAIR; as infant  . KNEE CARTILAGE SURGERY  05/2007   Procedure: KNEE CARTILAGE SURGERY; right meniscus   Family History  Problem Relation Name Age of Onset  . Cirrhosis Father     Social History   Tobacco Use  . Smoking status: Never  . Smokeless tobacco: Never  Substance Use Topics  . Alcohol use: Yes  . Drug use: No     Medications: Current Outpatient Medications  Medication Instructions  . aspirin 81 mg, Daily  . buPROPion (WELLBUTRIN XL) 300 mg, oral, Every morning  . carvediloL (COREG) 3.125 mg tablet TAKE 1 TABLET (3.125 MG TOTAL) BY MOUTH IN THE MORNING AND IN THE EVENING WITH MEALS  . diclofenac (VOLTAREN) 75 mg, oral, 2 times daily  . DULoxetine (CYMBALTA) 60 mg, oral, Daily  . DULoxetine (CYMBALTA) 30 mg, oral, Daily, Take with 60 mg for 90 mg total.  . glucose blood (Blood Glucose Test) test strip Use to check blood sugars up to two times daily. Diagnosis code: E11.9  . glucose monitoring kit kit Use as instructed to check blood sugars up to twice daily. Diagnosis code: E11.9  . insulin degludec (TRESIBA FLEXTOUCH) 50 Units, subcutaneous, 2 times daily  . lancets 33 gauge misc Use to check blood sugars up to 2 times daily. Diagnosis code: E11.9  . lisinopriL-hydrochlorothiazide (PRINZIDE) 20-25 mg per tablet 1 tablet, oral, Daily  . metFORMIN (GLUCOPHAGE) 1,000 mg, oral, 2 times daily  with meals  . pregabalin (LYRICA) 150 mg, oral, 2 times daily  . rosuvastatin (CRESTOR) 20 mg, oral, Daily  . semaglutide (Ozempic) 0.25 mg or 0.5 mg (2 mg/3 mL) subcutaneous pen injector INJECT 0.5MG  UNDER THE SKIN EVERY 7 DAYS    Allergies: No Known Allergies  Data  Vital Signs: There were no vitals taken for this visit.  Physical Exam: General: Alert, pleasant, well-nourished, no  acute distress HEENT:  NCAT, EOMI, vision grossly intact, no LAD  CV: RRR, normal S1, S2, with no murmurs, rubs or gallops Resp: Normal respiratory effort, lungs clear to auscultation bilaterally Abd: Soft, nontender, nondistended, BS present, no lipohypertrophy present  Neuro: CN2-12 grossly intact, answers questions appropriately  Pysch: Pleasant mood, appropriate affect Extremities: Lower extremities warm, well perfused, no edema noted Skin: no rashes or wounds noted FEET: absent pedal pulses, no lesions    Foot Exam:  Decreased sensation? Yes Pre-callous lesions? No Amputations?  No Skin break? No Decreased perfusion?  Yes   Pertinent Labs: A1c Lab Results  Component Value Date   HGBA1C 12.2 (H) 12/22/2023   HGBA1C 15.7 (H) 08/31/2023   HGBA1C 15.0 (H) 10/27/2022   HGBA1C 15.0 (H) 10/27/2022   HGBA1C 13.8 (H) 03/25/2022   HGBA1C 13.8 (H) 03/25/2022   HGBA1C 12.1 (H) 03/19/2021   HGBA1C 12.1 (H) 03/19/2021   HGBA1C 12.3 (H) 10/15/2020   HGBA1C 12.3 (H) 10/15/2020    LIPIDS Lab Results  Component Value Date   CHOL 156 12/22/2023   TRIG 187 (H) 12/22/2023   HDL 40 (L) 12/22/2023   LDLCALC 88 12/22/2023    Lab Results  Component Value Date   LDLCALC 88 12/22/2023    MICROALBUMIN No results found for: MACKEY CURRENT  BMP/Cr/GFR Lab Results  Component Value Date   GLUCOSE 280 (H) 12/22/2023   CALCIUM 10.1 12/22/2023   NA 138 12/22/2023   K 5.0 12/22/2023   CO2 32 (H) 12/22/2023   CL 97 (L) 12/22/2023   BUN 23 12/22/2023   CREATININE 0.73 12/22/2023    Lab Results  Component Value Date   CREATININE 0.73 12/22/2023    Assessment  Larry Hammond is a 53 y.o.  male with a past medical history of above who presents for an initial consultation visit.  1. Type 2 diabetes mellitus with hyperglycemia, with long-term current use of insulin (HCC)      2. Hypertension, unspecified type      3. Hyperlipidemia LDL goal <100        Plan   1)  Type 2 Diabetes, NOT controlled, complicated by obesity, neuropathy, financial issues - Current hemoglobin A1c goal is *7%*.   BG monitoring: Enc to check once a day  Labs: DMLabs: BMP, Fasting Lipids , Fructosamine, C-Peptide + serum glucose , and Urine Microalbumin  Meds:  Increase Ozempic to 1 mg Changing to [place] u200 due to large volume of dose of 100 units   Referrals  -Referral to CDE for DSME -Referral to podiatry for complete lack of sensation in legs and feet, no palpable pulses, has been increased to Lyrica 150   Education/Counseling:  -Patient advised to check blood sugars **   2) HLD Improved Rosuvastatin 20 mg  3) HTN Diastolic is mildly elevated Systolic is at goal Says he gets anxious at MD visits Lisinopril/HCTZ 20/25

## 2024-05-02 ENCOUNTER — Emergency Department (HOSPITAL_BASED_OUTPATIENT_CLINIC_OR_DEPARTMENT_OTHER)

## 2024-05-02 ENCOUNTER — Emergency Department (HOSPITAL_BASED_OUTPATIENT_CLINIC_OR_DEPARTMENT_OTHER)
Admission: EM | Admit: 2024-05-02 | Discharge: 2024-05-02 | Disposition: A | Discharge status: Home / Self Care | Source: Ambulatory Visit | Attending: Emergency Medicine | Admitting: Emergency Medicine

## 2024-05-02 ENCOUNTER — Other Ambulatory Visit: Payer: Self-pay

## 2024-05-02 ENCOUNTER — Other Ambulatory Visit (HOSPITAL_BASED_OUTPATIENT_CLINIC_OR_DEPARTMENT_OTHER): Payer: Self-pay

## 2024-05-02 DIAGNOSIS — Z79899 Other long term (current) drug therapy: Secondary | ICD-10-CM | POA: Insufficient documentation

## 2024-05-02 DIAGNOSIS — I4891 Unspecified atrial fibrillation: Secondary | ICD-10-CM

## 2024-05-02 DIAGNOSIS — D72829 Elevated white blood cell count, unspecified: Secondary | ICD-10-CM | POA: Insufficient documentation

## 2024-05-02 DIAGNOSIS — Z7901 Long term (current) use of anticoagulants: Secondary | ICD-10-CM | POA: Insufficient documentation

## 2024-05-02 DIAGNOSIS — R000 Tachycardia, unspecified: Secondary | ICD-10-CM | POA: Diagnosis present

## 2024-05-02 DIAGNOSIS — I10 Essential (primary) hypertension: Secondary | ICD-10-CM | POA: Diagnosis not present

## 2024-05-02 LAB — BASIC METABOLIC PANEL WITH GFR
Anion gap: 12 (ref 5–15)
BUN: 24 mg/dL — ABNORMAL HIGH (ref 6–20)
CO2: 27 mmol/L (ref 22–32)
Calcium: 9.7 mg/dL (ref 8.9–10.3)
Chloride: 99 mmol/L (ref 98–111)
Creatinine, Ser: 0.89 mg/dL (ref 0.61–1.24)
GFR, Estimated: >60 mL/min (ref >60)
Glucose, Bld: 194 mg/dL — ABNORMAL HIGH (ref 70–99)
Potassium: 4.2 mmol/L (ref 3.5–5.1)
Sodium: 137 mmol/L (ref 135–145)

## 2024-05-02 LAB — CBC
HCT: 39.7 % (ref 39.0–52.0)
Hemoglobin: 13.3 g/dL (ref 13.0–17.0)
MCH: 27.1 pg (ref 26.0–34.0)
MCHC: 33.5 g/dL (ref 30.0–36.0)
MCV: 81 fL (ref 80.0–100.0)
Platelets: 293 10*3/uL (ref 150–400)
RBC: 4.9 MIL/uL (ref 4.22–5.81)
RDW: 12.9 % (ref 11.5–15.5)
WBC: 13.5 10*3/uL — ABNORMAL HIGH (ref 4.0–10.5)
nRBC: 0 % (ref 0.0–0.2)

## 2024-05-02 LAB — TSH: TSH: 1.99 u[IU]/mL (ref 0.350–4.500)

## 2024-05-02 LAB — MAGNESIUM: Magnesium: 1.4 mg/dL — ABNORMAL LOW (ref 1.7–2.4)

## 2024-05-02 MED ORDER — APIXABAN 5 MG PO TABS
5.0000 mg | ORAL_TABLET | Freq: Two times a day (BID) | ORAL | 0 refills | Status: DC
  Filled 2024-05-02: qty 60, 30d supply, fill #0

## 2024-05-02 MED ORDER — APIXABAN (ELIQUIS) EDUCATION KIT FOR DVT/PE PATIENTS: PACK | Freq: Once | Status: DC

## 2024-05-02 MED ORDER — METOPROLOL TARTRATE 5 MG/5ML IV SOLN
5.0000 mg | Freq: Once | INTRAVENOUS | Status: AC
  Administered 2024-05-02: 5 mg via INTRAVENOUS
  Filled 2024-05-02: qty 5

## 2024-05-02 MED ORDER — METOPROLOL TARTRATE 25 MG PO TABS
25.0000 mg | ORAL_TABLET | Freq: Once | ORAL | Status: AC
  Administered 2024-05-02: 25 mg via ORAL
  Filled 2024-05-02: qty 1

## 2024-05-02 MED ORDER — METOPROLOL TARTRATE 25 MG PO TABS
25.0000 mg | ORAL_TABLET | Freq: Two times a day (BID) | ORAL | 0 refills | Status: DC
  Filled 2024-05-02: qty 20, 10d supply, fill #0

## 2024-05-02 NOTE — ED Notes (Signed)
 DC paperwork given and verbally understood.

## 2024-05-02 NOTE — Progress Notes (Signed)
 ON-CALL CARDIOLOGY 05/02/24  Patient's name: Larry Hammond.   MRN: [id number].    DOB: [birth year] Primary care provider: Evangelina Tinnie Norris, PA-C. Referring Physician: Charlyn Sora, MD  Referring physician: [facility] Consulting provider: [facility]  RE: New onset AFib  Interaction regarding this patient's care today: ER physician reached out as the patient presents to the ED for new onset of atrial fibrillation with rapid ventricular rate.  Patient had a scheduled outpatient cataract surgery and preoperatively was noted to have an irregular rhythm on telemetry as advised and was advised to go to the ER for further evaluation.  Cataract surgery canceled  EKG confirms A-fib with RVR.  ED physician administered IV Lopressor followed by oral Lopressor ventricular rates are 110 bpm.  Blood pressures are stable.  Patient is asymptomatic.  Duration of onset of A-fib is unknown.  Requested cardiology recommendations with regards to inpatient or outpatient workup.     LABORATORY DATA:    Latest Ref Rng & Units 05/02/2024    8:16 AM 03/22/2021    4:35 PM  CBC  WBC 4.0 - 10.5 K/uL 13.5  13.7   Hemoglobin 13.0 - 17.0 g/dL 86.6  83.9   Hematocrit 39.0 - 52.0 % 39.7  46.3   Platelets 150 - 400 K/uL 293  395        Latest Ref Rng & Units 05/02/2024    8:16 AM 03/22/2021    4:35 PM  CMP  Glucose 70 - 99 mg/dL 805  611   BUN 6 - 20 mg/dL 24  17   Creatinine 9.38 - 1.24 mg/dL 9.10  9.36   Sodium 864 - 145 mmol/L 137  128   Potassium 3.5 - 5.1 mmol/L 4.2  3.8   Chloride 98 - 111 mmol/L 99  93   CO2 22 - 32 mmol/L 27  26   Calcium 8.9 - 10.3 mg/dL 9.7  9.3   Total Protein 6.5 - 8.1 g/dL  7.2   Total Bilirubin 0.3 - 1.2 mg/dL  0.5   Alkaline Phos 38 - 126 U/L  93   AST 15 - 41 U/L  16   ALT 0 - 44 U/L  18    Recent Labs    05/02/24 0816  TSH 1.990    BMP Recent Labs    05/02/24 0816  NA 137  K 4.2  CL 99  CO2 27  GLUCOSE 194*  BUN 24*  CREATININE  0.89  CALCIUM 9.7  GFRNONAA >60    Impression: New onset of atrial fibrillation Diabetes. Hypertension. OSA  Recommendations: Since the patient is asymptomatic and ventricular rate has improved with administration of Lopressor is very reasonable to proceed forward with outpatient workup.  No indication for cardioversion at this time as the onset of A-fib is unknown.  Recommend starting Lopressor 25 mg p.o. twice daily with holding parameters.  Hold if SBP <100 mmHg or heart rate <55 bpm.  Anticoagulation given his CHA2DS2-VASc is greater than 2.  As long as no contraindications for blood thinners.   ED physician to discuss risks, benefits, alternatives to anticoagulation.  Will arrange outpatient follow-up with A-fib clinic in 3 weeks to evaluate rhythm and further management of his atrial fibrillation. Coordinated care 05/16/24 3pm for afib clinic.   ED physician to review reasons to come back to the ED if he becomes symptomatic.  Telephone encounter total time: 15 minutes  Lashena Signer Michele, DO, [facility]  A Division of [employer]  [facility] [address], [place], [place] [address]  [place], [place] [address] 05/02/2024 10:17 AM

## 2024-05-02 NOTE — ED Provider Notes (Signed)
 [place] EMERGENCY DEPARTMENT AT [facility] Provider Note   CSN: [id number] Arrival date & time: 05/02/24  9257     Patient presents with: Irregular Heart Beat   Larry Hammond is a 53 y.o. male.   HPI    53 year old patient comes in with chief complaint of fast heart rate.  Patient has history of hypertension, OSA.  He does not have any cardiovascular disease history.  He was supposed to get cataract procedure today.  He was anxious/nervous about it.  When he went to the preop area, as soon as he was hooked to the computer he was found to have heart rate in the 130s, and the anesthesiologist noted that he was in A-fib.  Patient states that he has not had any palpitations, chest discomfort, shortness of breath.  His activity level has not declined over the last several weeks.  He denies any drug use.  Any history of DVT/PE.  Patient currently not having any chest pain, shortness of breath, dizziness.   Prior to Admission medications   Medication Sig Start Date End Date Taking? Authorizing Provider  apixaban (ELIQUIS) 5 MG TABS tablet Take 1 tablet (5 mg total) by mouth 2 (two) times daily. 05/02/24 06/01/24 Yes Charlyn Sora, MD  metoprolol tartrate (LOPRESSOR) 25 MG tablet Take 1 tablet (25 mg total) by mouth in the morning and at bedtime. 05/02/24  Yes Ajeet Casasola, MD  OZEMPIC, 1 MG/DOSE, 4 MG/3ML SOPN Inject 1 mg into the skin once a week. 03/27/24  Yes [provider]  buPROPion (WELLBUTRIN XL) 150 MG 24 hr tablet Take 150 mg by mouth daily.    [provider]  buPROPion (WELLBUTRIN XL) 300 MG 24 hr tablet Take 300 mg by mouth daily.    [provider]  diclofenac (VOLTAREN) 75 MG EC tablet Take 75 mg by mouth 2 (two) times daily.    [provider]  dicyclomine  (BENTYL ) 20 MG tablet Take 1 tablet (20 mg total) by mouth 3 (three) times daily as needed for spasms. 03/22/21   Long, Joshua G, MD  DULoxetine (CYMBALTA) 60 MG capsule Take 60 mg by  mouth daily.    [provider]  lisinopril-hydrochlorothiazide (ZESTORETIC) 20-25 MG tablet Take 1 tablet by mouth daily.    [provider]  metFORMIN (GLUCOPHAGE) 1000 MG tablet Take 1,000 mg by mouth 2 (two) times daily with a meal.    [provider]  rosuvastatin (CRESTOR) 20 MG tablet Take 20 mg by mouth daily.    [provider]  senna-docusate (SENOKOT-S) 8.6-50 MG tablet Take 1 tablet by mouth at bedtime as needed for mild constipation or moderate constipation. 03/22/21   Long, Joshua G, MD  simvastatin (ZOCOR) 80 MG tablet Take 80 mg by mouth daily.    [provider]    Allergies: No known allergies    Review of Systems  All other systems reviewed and are negative.   Updated Vital Signs BP 110/80   Pulse 78   Temp 98.1 F (36.7 C) (Oral)   Resp 19   SpO2 100%   Physical Exam Vitals and nursing note reviewed.  Constitutional:      Appearance: He is well-developed.  HENT:     Head: Atraumatic.  Eyes:     Extraocular Movements: Extraocular movements intact.     Pupils: Pupils are equal, round, and reactive to light.  Cardiovascular:     Rate and Rhythm: Tachycardia present. Rhythm irregular.  Pulmonary:  Effort: Pulmonary effort is normal.  Musculoskeletal:     Cervical back: Neck supple.  Skin:    General: Skin is warm.  Neurological:     Mental Status: He is alert and oriented to person, place, and time.     (all labs ordered are listed, but only abnormal results are displayed) Labs Reviewed  BASIC METABOLIC PANEL WITH GFR - Abnormal; Notable for the following components:      Result Value   Glucose, Bld 194 (*)    BUN 24 (*)    All other components within normal limits  MAGNESIUM - Abnormal; Notable for the following components:   Magnesium 1.4 (*)    All other components within normal limits  CBC - Abnormal; Notable for the following components:   WBC 13.5 (*)    All other components within normal  limits  TSH    EKG: EKG Interpretation Date/Time:  Thursday May 02 2024 07:56:01 EDT Ventricular Rate:  132 PR Interval:    QRS Duration:  96 QT Interval:  305 QTC Calculation: 452 R Axis:   84  Text Interpretation: Atrial fibrillation new onset afib No old tracing to compare Confirmed by Charlyn Sora ([id number]) on 05/02/2024 9:13:35 AM  Radiology: [facility] Chest Port 1 View Result Date: 05/02/2024 CLINICAL DATA:  Atrial fibrillation EXAM: PORTABLE CHEST 1 VIEW COMPARISON:  None Available. FINDINGS: The heart size and mediastinal contours are within normal limits. Both lungs are clear. The visualized skeletal structures are unremarkable. IMPRESSION: No active disease. Electronically Signed   By: Oneil Devonshire M.D.   On: 05/02/2024 09:24     .Critical Care  Performed by: Charlyn Sora, MD Authorized by: Charlyn Sora, MD   Critical care provider statement:    Critical care time (minutes):  35   Critical care was necessary to treat or prevent imminent or life-threatening deterioration of the following conditions:  Circulatory failure   Critical care was time spent personally by me on the following activities:  Development of treatment plan with patient or surrogate, discussions with consultants, evaluation of patient's response to treatment, examination of patient, ordering and review of laboratory studies, ordering and review of radiographic studies, ordering and performing treatments and interventions, pulse oximetry, re-evaluation of patient's condition, review of old charts and obtaining history from patient or surrogate    Medications Ordered in the ED  apixaban WINN) Education Kit for DVT/PE patients (has no administration in time range)  metoprolol tartrate (LOPRESSOR) injection 5 mg (5 mg Intravenous Given 05/02/24 0821)  metoprolol tartrate (LOPRESSOR) tablet 25 mg (25 mg Oral Given 05/02/24 0912)    Clinical Course as of 05/02/24 1032  Thu May 02, 2024  1031 Heart rate  consistently now below 110.  Heart rate hovering between 90-120 still. However, patient remains asymptomatic.  I have discussed the case with Dr. Michele. He is comfortable with patient going home with metoprolol 25 mg twice daily and Eliquis.  He gave me some parameters which I have discussed with the patient already.  I have also typed those parameters out.  A-fib clinic follow-up has been provided.  Return precautions have been discussed. [AN]    Clinical Course User Index [AN] Charlyn Sora, MD         CHA2DS2-VASc Score: 2                        Medical Decision Making Amount and/or Complexity of Data Reviewed Labs: ordered. Radiology: ordered.  Risk Prescription drug  management.   53 year old patient comes in with chief complaint of irregular, rapid heart rate.  I reviewed patient's records including PCP note.  It appears that he has hypertension, OSA.  He has no cardiac disease history and there are no echocardiograms in our results.  Differential diagnosis for new onset A-fib, asymptomatic in this patient includes A-fib secondary extending hypertension, OSA, electrolyte abnormality.  Secondary conditions such as hyperthyroidism, sepsis, PE deemed less likely.  It is also possible that patient's anxiety led to a sympathomimetic storm that led to new onset A-fib.  However, he is asymptomatic and at this time plan will be to focus on rate control.  If he will achieve rate control, then we will discharge patient with A-fib clinic follow-up.  Patient's CHA2DS2-VASc score is 2.  I have ordered 5 mg of IV metoprolol.  Reassessment: Patient's heart rate has improved.  Heart rate now no longer in the 130s, primarily jumping between 90 and 120.  Oral metoprolol has been ordered. I have independently interpreted patient's labs, they are overall reassuring besides slight elevation of white count.  Patient currently on antibiotics. X-ray of the chest independently interpreted, no evidence  of CHF.  Final diagnoses:  New onset a-fib [facility])    ED Discharge Orders          Ordered    Amb referral to AFIB Clinic        05/02/24 0806    metoprolol tartrate (LOPRESSOR) 25 MG tablet  2 times daily       Note to Pharmacy: DO NOT TAKE IF YOUR SYSTOLIC BLOOD PRESSURE (TOP NUMBER) IS LESS THAN 100 MMHG OR YOUR HEART RATE IS LESS THAN 60   05/02/24 1023    apixaban (ELIQUIS) 5 MG TABS tablet  2 times daily        05/02/24 1023               Charlyn Sora, MD 05/02/24 1032

## 2024-05-02 NOTE — ED Triage Notes (Signed)
 Pt arrived POV from ophthalmology reporting he went for cataract procedure today and was told to go to ED d/t abnormal ECG showing afib RVR. Pt states he has felt a little anxious over procedure this morning but denies SOB, CP, N/V, dizziness, palpitations.

## 2024-05-02 NOTE — ED Notes (Signed)
 Pt given the coupons, per [email or web address]

## 2024-05-02 NOTE — Discharge Instructions (Addendum)
 You were seen in the emergency room for new onset atrial fibrillation. Please read the instructions provided on A-fib and also the medications that are prescribed.  As discussed, with A-fib comes risk for stroke therefore we will start you on blood thinner. To keep your heart rate and check, we are prescribing you metoprolol 25 mg twice a day.  Prior to taking this medication, you should check your heart rate and blood pressure.  If the top blood pressure is less than 100 or your heart rate is less than 60, then you can skip the dose of metoprolol.  We have set up an appointment with the A-fib clinic -please call and confirm your appointment with them.  Return to the emergency room if you start having chest pain, shortness of breath, severe palpitations, persistent elevated heart rate or 140 despite taking your metoprolol.

## 2024-05-09 ENCOUNTER — Ambulatory Visit (HOSPITAL_COMMUNITY)
Admission: RE | Admit: 2024-05-09 | Discharge: 2024-05-09 | Disposition: A | Discharge status: Home / Self Care | Source: Ambulatory Visit | Attending: Internal Medicine | Admitting: Internal Medicine

## 2024-05-09 ENCOUNTER — Other Ambulatory Visit (HOSPITAL_COMMUNITY): Payer: Self-pay

## 2024-05-09 VITALS — BP 132/82 | HR 113 | Ht 71.0 in | Wt 282.6 lb

## 2024-05-09 DIAGNOSIS — I4891 Unspecified atrial fibrillation: Secondary | ICD-10-CM

## 2024-05-09 DIAGNOSIS — I4819 Other persistent atrial fibrillation: Secondary | ICD-10-CM

## 2024-05-09 DIAGNOSIS — D6869 Other thrombophilia: Secondary | ICD-10-CM | POA: Diagnosis not present

## 2024-05-09 MED ORDER — APIXABAN 5 MG PO TABS: 5.0000 mg | ORAL_TABLET | Freq: Two times a day (BID) | ORAL | 3 refills | Status: DC

## 2024-05-09 MED ORDER — METOPROLOL TARTRATE 25 MG PO TABS: 25.0000 mg | ORAL_TABLET | Freq: Two times a day (BID) | ORAL | 3 refills | Status: DC

## 2024-05-09 MED ORDER — MAGNESIUM 400 MG PO CAPS: 400.0000 mg | ORAL_CAPSULE | Freq: Every day | ORAL | 2 refills | Status: AC

## 2024-05-09 NOTE — Patient Instructions (Signed)
 Hold Ozempic after 05/18/24 Saturday until procedure and resume back after procedure   Kardia mobile 1 lead   Start Magnesium  400 mg once daily   Echocardiogram -- scheduling will call once insurance authorization received.   Cardioversion scheduled for: Monday 05/27/24   - Arrive at the [facility] ([address]  and check in with ADMITTING at 6:30 am    - Do not eat or drink anything after midnight the night prior to your procedure.   - Take all your morning medication (except diabetic medications) with a sip of water prior to arrival.  - Do NOT miss any doses of your blood thinner - if you should miss a dose or take a dose more than 4 hours late -- please notify our office immediately.  - You will not be able to drive home after your procedure. Please ensure you have a responsible adult to drive you home. You will need someone with you for 24 hours post procedure.     - Expect to be in the procedural area approximately 2 hours.   - If you feel as if you go back into normal rhythm prior to scheduled cardioversion, please notify our office immediately.   If your procedure is canceled in the cardioversion suite you will be charged a cancellation fee.    Hold below medications 7 days prior to scheduled procedure/anesthesia.  Restart medication on the normal dosing day after scheduled procedure/anesthesia  Semaglutide (Ozempic) [facility])    For those patients who have a scheduled procedure/anesthesia on the same day of the week as their dose, hold the medication on the day of surgery.  They can take their scheduled dose the week before.  **Patients on the above medications scheduled for elective procedures that have not held the medication for the appropriate amount of time are at risk of cancellation or change in the anesthetic plan.

## 2024-05-09 NOTE — Progress Notes (Signed)
 Primary Care Physician: Evangelina Tinnie Norris, PA-C Primary Cardiologist: None Electrophysiologist: None     Referring Physician: Dr. Michele Norleen Last is a 53 y.o. male with a history of HTN, T2DM, OSA, and cataract who presents for consultation in the [facility]. ED visit on 05/02/24 for new Afib with RVR found at scheduled outpatient cataract surgery. Prescribed Lopressor  25 mg BID. Patient is on Eliquis  5 mg BID for a CHADS2VASC score of 2.  On evaluation today, he is currently in Afib. He notes at home his HR is in the 70-80 bpm range. He has been using CPAP since 2004. He has not missed any doses of Eliquis  since 7/4. He does not have cardiac awareness.  Today, he denies symptoms of palpitations, chest pain, shortness of breath, orthopnea, PND, lower extremity edema, dizziness, presyncope, syncope, snoring, daytime somnolence, bleeding, or neurologic sequela. The patient is tolerating medications without difficulties and is otherwise without complaint today.    Atrial Fibrillation Risk Factors:  he does have symptoms or diagnosis of sleep apnea. he is compliant with CPAP therapy.   he has a BMI of Body mass index is 39.41 kg/m.SABRA Filed Weights   05/09/24 0936  Weight: 128.2 kg    Current Outpatient Medications  Medication Sig Dispense Refill   buPROPion (WELLBUTRIN XL) 300 MG 24 hr tablet Take 300 mg by mouth daily.     diclofenac (VOLTAREN) 75 MG EC tablet Take 75 mg by mouth 2 (two) times daily.     dicyclomine  (BENTYL ) 20 MG tablet Take 1 tablet (20 mg total) by mouth 3 (three) times daily as needed for spasms. 20 tablet 0   DULoxetine (CYMBALTA) 60 MG capsule Take 60 mg by mouth daily.     lisinopril-hydrochlorothiazide (ZESTORETIC) 20-25 MG tablet Take 1 tablet by mouth daily.     Magnesium  400 MG CAPS Take 400 mg by mouth daily. 30 capsule 2   metFORMIN (GLUCOPHAGE) 1000 MG tablet Take 1,000 mg by mouth 2 (two) times daily with  a meal.     OZEMPIC, 1 MG/DOSE, 4 MG/3ML SOPN Inject 1 mg into the skin once a week.     pregabalin (LYRICA) 150 MG capsule Take 150 mg by mouth 2 (two) times daily.     rosuvastatin (CRESTOR) 20 MG tablet Take 20 mg by mouth daily.     senna-docusate (SENOKOT-S) 8.6-50 MG tablet Take 1 tablet by mouth at bedtime as needed for mild constipation or moderate constipation. 20 tablet 0   TRESIBA FLEXTOUCH 200 UNIT/ML FlexTouch Pen SMARTSIG:100 Unit(s) SUB-Q Daily     apixaban  (ELIQUIS ) 5 MG TABS tablet Take 1 tablet (5 mg total) by mouth 2 (two) times daily. 60 tablet 3   metoprolol  tartrate (LOPRESSOR ) 25 MG tablet Take 1 tablet (25 mg total) by mouth in the morning and at bedtime. 30 tablet 3   No current facility-administered medications for this encounter.    Atrial Fibrillation Management history:  Previous antiarrhythmic drugs: none Previous cardioversions: none Previous ablations: none Anticoagulation history: Eliquis    ROS- All systems are reviewed and negative except as per the HPI above.  Physical Exam: BP 132/82   Pulse (!) 113   Ht 5' 11 (1.803 m)   Wt 128.2 kg   BMI 39.41 kg/m   GEN: Well nourished, well developed in no acute distress NECK: No JVD; No carotid bruits CARDIAC: Irregularly irregular tachycardic rate and rhythm, no murmurs, rubs, gallops RESPIRATORY:  Clear to  auscultation without rales, wheezing or rhonchi  ABDOMEN: Soft, non-tender, non-distended EXTREMITIES:  No edema; No deformity   EKG today demonstrates  Vent. rate 113 BPM PR interval * ms QRS duration 78 ms QT/QTcB 302/414 ms P-R-T axes * 102 3 Atrial fibrillation with rapid ventricular response Rightward axis Low voltage QRS Abnormal ECG When compared with ECG of 02-May-2024 07:56, PREVIOUS ECG IS PRESENT  Echo N/A  ASSESSMENT & PLAN CHA2DS2-VASc Score = 2  The patient's score is based upon: CHF History: 0 HTN History: 1 Diabetes History: 1 Stroke History: 0 Vascular Disease  History: 0 Age Score: 0 Gender Score: 0       ASSESSMENT AND PLAN: Persistent Atrial Fibrillation (ICD10:  I48.19) The patient's CHA2DS2-VASc score is 2, indicating a 2.2% annual risk of stroke.    He is currently in Afib. Education provided about Afib. Discussion about triggers and medication treatments and ablation going forward if indicated. Rhythm monitoring device recommended. We discussed the procedure cardioversion to try to convert to NSR. We discussed the risks vs benefits of this procedure and how ultimately we cannot predict whether a patient will have early return of arrhythmia post procedure. After discussion, the patient wishes to proceed with cardioversion. Labs drawn on 7/3. Will order baseline echocardiogram. Will have to hold Ozempic.  Informed Consent   Shared Decision Making/Informed Consent The risks (stroke, cardiac arrhythmias rarely resulting in the need for a temporary or permanent pacemaker, skin irritation or burns and complications associated with conscious sedation including aspiration, arrhythmia, respiratory failure and death), benefits (restoration of normal sinus rhythm) and alternatives of a direct current cardioversion were explained in detail to Mr. Whitter and he agrees to proceed.        Secondary Hypercoagulable State (ICD10:  D68.69) The patient is at significant risk for stroke/thromboembolism based upon his CHA2DS2-VASc Score of 2.  Continue Apixaban  (Eliquis ).   No missed doses.     Follow up 2 weeks after DCCV.   Terra Pac, PA-C  Afib Clinic [facility] [address] [place], [place] [address] [phone]

## 2024-05-09 NOTE — H&P (View-Only) (Signed)
 Primary Care Physician: Evangelina Tinnie Norris, PA-C Primary Cardiologist: None Electrophysiologist: None     Referring Physician: Dr. Michele Norleen Last is a 53 y.o. male with a history of HTN, T2DM, OSA, and cataract who presents for consultation in the [facility]. ED visit on 05/02/24 for new Afib with RVR found at scheduled outpatient cataract surgery. Prescribed Lopressor  25 mg BID. Patient is on Eliquis  5 mg BID for a CHADS2VASC score of 2.  On evaluation today, he is currently in Afib. He notes at home his HR is in the 70-80 bpm range. He has been using CPAP since 2004. He has not missed any doses of Eliquis  since 7/4. He does not have cardiac awareness.  Today, he denies symptoms of palpitations, chest pain, shortness of breath, orthopnea, PND, lower extremity edema, dizziness, presyncope, syncope, snoring, daytime somnolence, bleeding, or neurologic sequela. The patient is tolerating medications without difficulties and is otherwise without complaint today.    Atrial Fibrillation Risk Factors:  he does have symptoms or diagnosis of sleep apnea. he is compliant with CPAP therapy.   he has a BMI of Body mass index is 39.41 kg/m.SABRA Filed Weights   05/09/24 0936  Weight: 128.2 kg    Current Outpatient Medications  Medication Sig Dispense Refill   buPROPion (WELLBUTRIN XL) 300 MG 24 hr tablet Take 300 mg by mouth daily.     diclofenac (VOLTAREN) 75 MG EC tablet Take 75 mg by mouth 2 (two) times daily.     dicyclomine  (BENTYL ) 20 MG tablet Take 1 tablet (20 mg total) by mouth 3 (three) times daily as needed for spasms. 20 tablet 0   DULoxetine (CYMBALTA) 60 MG capsule Take 60 mg by mouth daily.     lisinopril-hydrochlorothiazide (ZESTORETIC) 20-25 MG tablet Take 1 tablet by mouth daily.     Magnesium  400 MG CAPS Take 400 mg by mouth daily. 30 capsule 2   metFORMIN (GLUCOPHAGE) 1000 MG tablet Take 1,000 mg by mouth 2 (two) times daily with  a meal.     OZEMPIC, 1 MG/DOSE, 4 MG/3ML SOPN Inject 1 mg into the skin once a week.     pregabalin (LYRICA) 150 MG capsule Take 150 mg by mouth 2 (two) times daily.     rosuvastatin (CRESTOR) 20 MG tablet Take 20 mg by mouth daily.     senna-docusate (SENOKOT-S) 8.6-50 MG tablet Take 1 tablet by mouth at bedtime as needed for mild constipation or moderate constipation. 20 tablet 0   TRESIBA FLEXTOUCH 200 UNIT/ML FlexTouch Pen SMARTSIG:100 Unit(s) SUB-Q Daily     apixaban  (ELIQUIS ) 5 MG TABS tablet Take 1 tablet (5 mg total) by mouth 2 (two) times daily. 60 tablet 3   metoprolol  tartrate (LOPRESSOR ) 25 MG tablet Take 1 tablet (25 mg total) by mouth in the morning and at bedtime. 30 tablet 3   No current facility-administered medications for this encounter.    Atrial Fibrillation Management history:  Previous antiarrhythmic drugs: none Previous cardioversions: none Previous ablations: none Anticoagulation history: Eliquis    ROS- All systems are reviewed and negative except as per the HPI above.  Physical Exam: BP 132/82   Pulse (!) 113   Ht 5' 11 (1.803 m)   Wt 128.2 kg   BMI 39.41 kg/m   GEN: Well nourished, well developed in no acute distress NECK: No JVD; No carotid bruits CARDIAC: Irregularly irregular tachycardic rate and rhythm, no murmurs, rubs, gallops RESPIRATORY:  Clear to  auscultation without rales, wheezing or rhonchi  ABDOMEN: Soft, non-tender, non-distended EXTREMITIES:  No edema; No deformity   EKG today demonstrates  Vent. rate 113 BPM PR interval * ms QRS duration 78 ms QT/QTcB 302/414 ms P-R-T axes * 102 3 Atrial fibrillation with rapid ventricular response Rightward axis Low voltage QRS Abnormal ECG When compared with ECG of 02-May-2024 07:56, PREVIOUS ECG IS PRESENT  Echo N/A  ASSESSMENT & PLAN CHA2DS2-VASc Score = 2  The patient's score is based upon: CHF History: 0 HTN History: 1 Diabetes History: 1 Stroke History: 0 Vascular Disease  History: 0 Age Score: 0 Gender Score: 0       ASSESSMENT AND PLAN: Persistent Atrial Fibrillation (ICD10:  I48.19) The patient's CHA2DS2-VASc score is 2, indicating a 2.2% annual risk of stroke.    He is currently in Afib. Education provided about Afib. Discussion about triggers and medication treatments and ablation going forward if indicated. Rhythm monitoring device recommended. We discussed the procedure cardioversion to try to convert to NSR. We discussed the risks vs benefits of this procedure and how ultimately we cannot predict whether a patient will have early return of arrhythmia post procedure. After discussion, the patient wishes to proceed with cardioversion. Labs drawn on 7/3. Will order baseline echocardiogram. Will have to hold Ozempic.  Informed Consent   Shared Decision Making/Informed Consent The risks (stroke, cardiac arrhythmias rarely resulting in the need for a temporary or permanent pacemaker, skin irritation or burns and complications associated with conscious sedation including aspiration, arrhythmia, respiratory failure and death), benefits (restoration of normal sinus rhythm) and alternatives of a direct current cardioversion were explained in detail to Mr. Whitter and he agrees to proceed.        Secondary Hypercoagulable State (ICD10:  D68.69) The patient is at significant risk for stroke/thromboembolism based upon his CHA2DS2-VASc Score of 2.  Continue Apixaban  (Eliquis ).   No missed doses.     Follow up 2 weeks after DCCV.   Terra Pac, PA-C  Afib Clinic [facility] [address] [place], [place] [address] [phone]

## 2024-05-16 ENCOUNTER — Ambulatory Visit (HOSPITAL_COMMUNITY): Admitting: Physician Assistant

## 2024-05-19 NOTE — Pre-Procedure Instructions (Signed)
 Attempted to call patient regarding procedure scheduled for 7/28 with anesthesia.  Anesthesia requires Ozempic to be held for 7 days prior to procedure.  Left voicemail that today is the last day you can take prior to you procedure. Don't take after today until after you procedure.

## 2024-05-24 NOTE — Progress Notes (Signed)
 Pt called for pre procedure instructions.  Voicemail left with instructions. Arrival time 0630 NPO after midnight explained Instructed to take am meds with sip of water and confirmed blood thinner consistency Instructed pt need for ride home tomorrow and have responsible adult with them for 24 hrs post procedure.

## 2024-05-27 ENCOUNTER — Ambulatory Visit (HOSPITAL_COMMUNITY): Admitting: Anesthesiology

## 2024-05-27 ENCOUNTER — Other Ambulatory Visit: Payer: Self-pay

## 2024-05-27 ENCOUNTER — Ambulatory Visit (HOSPITAL_COMMUNITY)
Admission: RE | Admit: 2024-05-27 | Discharge: 2024-05-27 | Disposition: A | Discharge status: Home / Self Care | Source: Ambulatory Visit | Attending: Cardiology | Admitting: Cardiology

## 2024-05-27 ENCOUNTER — Encounter (HOSPITAL_COMMUNITY)
Admission: RE | Disposition: A | Discharge status: Home / Self Care | Payer: Self-pay | Source: Ambulatory Visit | Attending: Cardiology

## 2024-05-27 ENCOUNTER — Encounter (HOSPITAL_COMMUNITY): Payer: Self-pay | Admitting: Cardiology

## 2024-05-27 DIAGNOSIS — G4733 Obstructive sleep apnea (adult) (pediatric): Secondary | ICD-10-CM | POA: Diagnosis not present

## 2024-05-27 DIAGNOSIS — Z7985 Long-term (current) use of injectable non-insulin antidiabetic drugs: Secondary | ICD-10-CM | POA: Insufficient documentation

## 2024-05-27 DIAGNOSIS — Z7901 Long term (current) use of anticoagulants: Secondary | ICD-10-CM | POA: Diagnosis not present

## 2024-05-27 DIAGNOSIS — D6869 Other thrombophilia: Secondary | ICD-10-CM | POA: Insufficient documentation

## 2024-05-27 DIAGNOSIS — Z794 Long term (current) use of insulin: Secondary | ICD-10-CM | POA: Diagnosis not present

## 2024-05-27 DIAGNOSIS — Z7984 Long term (current) use of oral hypoglycemic drugs: Secondary | ICD-10-CM | POA: Diagnosis not present

## 2024-05-27 DIAGNOSIS — I4819 Other persistent atrial fibrillation: Secondary | ICD-10-CM | POA: Insufficient documentation

## 2024-05-27 DIAGNOSIS — I10 Essential (primary) hypertension: Secondary | ICD-10-CM | POA: Diagnosis not present

## 2024-05-27 DIAGNOSIS — Z79899 Other long term (current) drug therapy: Secondary | ICD-10-CM | POA: Insufficient documentation

## 2024-05-27 DIAGNOSIS — E119 Type 2 diabetes mellitus without complications: Secondary | ICD-10-CM | POA: Insufficient documentation

## 2024-05-27 DIAGNOSIS — I4891 Unspecified atrial fibrillation: Secondary | ICD-10-CM | POA: Diagnosis not present

## 2024-05-27 LAB — GLUCOSE, CAPILLARY: Glucose-Capillary: 142 mg/dL — ABNORMAL HIGH (ref 70–99)

## 2024-05-27 SURGERY — CARDIOVERSION (CATH LAB)
Anesthesia: General

## 2024-05-27 MED ORDER — PHENYLEPHRINE 80 MCG/ML (10ML) SYRINGE FOR IV PUSH (FOR BLOOD PRESSURE SUPPORT)
PREFILLED_SYRINGE | INTRAVENOUS | Status: DC | PRN
  Administered 2024-05-27: 80 ug via INTRAVENOUS

## 2024-05-27 MED ORDER — PROPOFOL 10 MG/ML IV BOLUS
INTRAVENOUS | Status: DC | PRN
  Administered 2024-05-27: 50 mg via INTRAVENOUS
  Administered 2024-05-27: 40 mg via INTRAVENOUS

## 2024-05-27 MED ORDER — METOPROLOL TARTRATE 50 MG PO TABS
50.0000 mg | ORAL_TABLET | ORAL | Status: AC
  Administered 2024-05-27: 50 mg via ORAL
  Filled 2024-05-27: qty 1

## 2024-05-27 MED ORDER — METOPROLOL TARTRATE 50 MG PO TABS: 50.0000 mg | ORAL_TABLET | Freq: Two times a day (BID) | ORAL | 0 refills | Status: DC

## 2024-05-27 SURGICAL SUPPLY — 1 items: PAD DEFIB RADIO PHYSIO CONN (PAD) ×1 IMPLANT

## 2024-05-27 NOTE — Anesthesia Preprocedure Evaluation (Signed)
 Anesthesia Evaluation  Patient identified by MRN, date of birth, ID band Patient awake    Reviewed: Allergy & Precautions, NPO status , Patient's Chart, lab work & pertinent test results  Airway Mallampati: II  TM Distance: >3 FB Neck ROM: Full    Dental   Pulmonary neg pulmonary ROS   Pulmonary exam normal        Cardiovascular hypertension, Pt. on home beta blockers and Pt. on medications Normal cardiovascular exam+ dysrhythmias      Neuro/Psych negative neurological ROS     GI/Hepatic negative GI ROS, Neg liver ROS,,,  Endo/Other  diabetes, Type 2    Renal/GU negative Renal ROS     Musculoskeletal   Abdominal   Peds  Hematology negative hematology ROS (+)   Anesthesia Other Findings   Reproductive/Obstetrics                              Anesthesia Physical Anesthesia Plan  ASA: 2  Anesthesia Plan: General   Post-op Pain Management:    Induction: Intravenous  PONV Risk Score and Plan: 2 and Treatment may vary due to age or medical condition  Airway Management Planned: Mask, Nasal Cannula and Natural Airway  Additional Equipment:   Intra-op Plan:   Post-operative Plan:   Informed Consent: I have reviewed the patients History and Physical, chart, labs and discussed the procedure including the risks, benefits and alternatives for the proposed anesthesia with the patient or authorized representative who has indicated his/her understanding and acceptance.       Plan Discussed with:   Anesthesia Plan Comments:         Anesthesia Quick Evaluation

## 2024-05-27 NOTE — Interval H&P Note (Signed)
 History and Physical Interval Note:  05/27/2024 7:43 AM  Larry Hammond  has presented today for surgery, with the diagnosis of AFIB.  The various methods of treatment have been discussed with the patient and family. After consideration of risks, benefits and other options for treatment, the patient has consented to  Procedure(s): CARDIOVERSION (N/A) as a surgical intervention.  The patient's history has been reviewed, patient examined, no change in status, stable for surgery.  I have reviewed the patient's chart and labs.  Questions were answered to the patient's satisfaction.     Informed Consent   Shared Decision Making/Informed Consent The risks (stroke, cardiac arrhythmias rarely resulting in the need for a temporary or permanent pacemaker, skin irritation or burns and complications associated with conscious sedation including aspiration, arrhythmia, respiratory failure and death), benefits (restoration of normal sinus rhythm) and alternatives of a direct current cardioversion were explained in detail to Larry Hammond and he agrees to proceed.       No missed doses of Eliquis  since July 3rd 2025. Contact person: Wife [phone] [facility])  Stacie Templin [place], [place], FACC

## 2024-05-27 NOTE — Transfer of Care (Signed)
 Immediate Anesthesia Transfer of Care Note  Patient: Larry Hammond  Procedure(s) Performed: CARDIOVERSION  Patient Location: PACU and Cath Lab  Anesthesia Type:MAC  Level of Consciousness: awake and alert   Airway & Oxygen Therapy: Patient Spontanous Breathing and Patient connected to nasal cannula oxygen  Post-op Assessment: Report given to RN and Post -op Vital signs reviewed and stable  Post vital signs: Reviewed and stable  Hammond Vitals:  Vitals Value Taken Time  BP 114/77   Temp    Pulse 94   Resp 20   SpO2 100     Hammond Pain:  Vitals:   05/27/24 0706  TempSrc: Temporal         Complications: No notable events documented.

## 2024-05-27 NOTE — CV Procedure (Addendum)
   DIRECT CURRENT CARDIOVERSION  NAME:  Larry Hammond    MRN: [id number] DOB:  Jan 09, 1971    ADMIT DATE: 05/27/2024  Indication:  Symptomatic persistent atrial fibrillation  Procedure Note:  The patient signed informed consent.  They have had had therapeutic anticoagulation with Eliquis  greater than 3 weeks.  Anesthesia was administered by Dr. Epifanio.  Adequate airway was maintained throughout and vital followed per protocol.  They were cardioverted x 2 (200J followed by 360J) of biphasic synchronized energy.  They converted to sinus rhythm or sinus tachycardia.  There were no apparent complications.  The patient had normal neuro status and respiratory status post procedure with vitals stable as recorded elsewhere.    Follow up:  Will give lopressor  50 mg po once.  Will increase Lopressor  from 25 mg po bid to 50 mg po bid.  Stop coreg 3.125 mg po bid Monitor BP at home.  Wife called twice goes to voicemail - patient updated.   Already has a follow up appointment in 2 weeks.   [name], [facility]  A Division of [facility] [address] [place], [place] [address]  [place], [place] [address] 8:33 AM

## 2024-05-28 ENCOUNTER — Ambulatory Visit (HOSPITAL_COMMUNITY): Payer: Self-pay | Admitting: Internal Medicine

## 2024-05-28 ENCOUNTER — Ambulatory Visit (HOSPITAL_COMMUNITY)
Admission: RE | Admit: 2024-05-28 | Discharge: 2024-05-28 | Disposition: A | Discharge status: Home / Self Care | Source: Ambulatory Visit | Attending: Internal Medicine | Admitting: Internal Medicine

## 2024-05-28 DIAGNOSIS — I080 Rheumatic disorders of both mitral and aortic valves: Secondary | ICD-10-CM | POA: Insufficient documentation

## 2024-05-28 DIAGNOSIS — I4891 Unspecified atrial fibrillation: Secondary | ICD-10-CM | POA: Diagnosis present

## 2024-05-28 LAB — ECHOCARDIOGRAM COMPLETE
Area-P 1/2: 3.97 cm2
Calc EF: 38.1 %
S' Lateral: 4.2 cm
Single Plane A2C EF: 35.7 %
Single Plane A4C EF: 37.5 %

## 2024-05-28 MED ORDER — PERFLUTREN LIPID MICROSPHERE
1.0000 mL | INTRAVENOUS | Status: AC | PRN
  Administered 2024-05-28: 1.5 mL via INTRAVENOUS

## 2024-05-28 NOTE — Anesthesia Postprocedure Evaluation (Signed)
 Anesthesia Post Note  Patient: Larry Hammond  Procedure(s) Performed: CARDIOVERSION     Patient location during evaluation: PACU Anesthesia Type: General Level of consciousness: awake and alert Pain management: pain level controlled Vital Signs Assessment: post-procedure vital signs reviewed and stable Respiratory status: spontaneous breathing, nonlabored ventilation, respiratory function stable and patient connected to nasal cannula oxygen Cardiovascular status: blood pressure returned to baseline and stable Postop Assessment: no apparent nausea or vomiting Anesthetic complications: no   No notable events documented.  Last Vitals:  Vitals:   05/27/24 0852 05/27/24 0900  BP: 112/83 116/80  Pulse: 95 98  Resp: 16 17  Temp:    SpO2: 96% 95%    Last Pain:  Vitals:   05/27/24 0849  TempSrc: Temporal  PainSc: 0-No pain                 Larry Hammond

## 2024-06-20 ENCOUNTER — Encounter (HOSPITAL_COMMUNITY): Payer: Self-pay | Admitting: Internal Medicine

## 2024-06-20 ENCOUNTER — Ambulatory Visit (HOSPITAL_COMMUNITY)
Admission: RE | Admit: 2024-06-20 | Discharge: 2024-06-20 | Disposition: A | Discharge status: Home / Self Care | Source: Ambulatory Visit | Attending: Internal Medicine | Admitting: Internal Medicine

## 2024-06-20 VITALS — BP 132/88 | HR 93 | Ht 73.0 in | Wt 284.8 lb

## 2024-06-20 DIAGNOSIS — I4819 Other persistent atrial fibrillation: Secondary | ICD-10-CM

## 2024-06-20 MED ORDER — AMIODARONE HCL 200 MG PO TABS: ORAL_TABLET | ORAL | 3 refills | Status: DC

## 2024-06-20 NOTE — Patient Instructions (Addendum)
 Start Amiodarone  200 mg twice a day for 30 days then decrease to 200 mg once a day  Do not miss any doses of Eliquis  / blood thinner- call our office if you do [phone]  GLENWOOD Sly RN [facility] Clinic

## 2024-06-20 NOTE — Progress Notes (Signed)
 Primary Care Physician: Evangelina Tinnie Norris, PA-C Primary Cardiologist: None Electrophysiologist: None     Referring Physician: Dr. Michele Norleen Last is a 53 y.o. male with a history of HTN, T2DM, OSA, and cataract who presents for consultation in the [facility]. ED visit on 05/02/24 for new Afib with RVR found at scheduled outpatient cataract surgery. Prescribed Lopressor  25 mg BID. Patient is on Eliquis  5 mg BID for a CHADS2VASC score of 2.  On follow up 06/20/24, patient is currently in Afib. S/p successful DCCV on 7/28. Echo performed on 7/29 showed mildly decreased LV systolic function. No missed doses of Eliquis .  Today, he denies symptoms of palpitations, chest pain, shortness of breath, orthopnea, PND, lower extremity edema, dizziness, presyncope, syncope, snoring, daytime somnolence, bleeding, or neurologic sequela. The patient is tolerating medications without difficulties and is otherwise without complaint today.    Atrial Fibrillation Risk Factors:  he does have symptoms or diagnosis of sleep apnea. he is compliant with CPAP therapy.   he has a BMI of Body mass index is 37.57 kg/m.SABRA Filed Weights   06/20/24 1047  Weight: 129.2 kg     Current Outpatient Medications  Medication Sig Dispense Refill   apixaban  (ELIQUIS ) 5 MG TABS tablet Take 1 tablet (5 mg total) by mouth 2 (two) times daily. 60 tablet 3   buPROPion (WELLBUTRIN XL) 300 MG 24 hr tablet Take 300 mg by mouth daily.     diclofenac (VOLTAREN) 75 MG EC tablet Take 75 mg by mouth 2 (two) times daily.     DULoxetine (CYMBALTA) 30 MG capsule Take 30 mg by mouth daily.     DULoxetine (CYMBALTA) 60 MG capsule Take 60 mg by mouth daily.     lisinopril-hydrochlorothiazide (ZESTORETIC) 20-25 MG tablet Take 1 tablet by mouth daily.     Magnesium  400 MG CAPS Take 400 mg by mouth daily. 30 capsule 2   metFORMIN (GLUCOPHAGE) 1000 MG tablet Take 1,000 mg by mouth 2 (two) times  daily with a meal.     metoprolol  tartrate (LOPRESSOR ) 50 MG tablet Take 1 tablet (50 mg total) by mouth in the morning and at bedtime. Hold if systolic blood pressure (top number) less than 100 mmHg or pulse less than 55 bpm. 60 tablet 0   omeprazole (PRILOSEC OTC) 20 MG tablet Take 40 mg by mouth daily.     OZEMPIC, 1 MG/DOSE, 4 MG/3ML SOPN Inject 1 mg into the skin once a week.     pregabalin (LYRICA) 150 MG capsule Take 150 mg by mouth 2 (two) times daily.     rosuvastatin (CRESTOR) 20 MG tablet Take 20 mg by mouth daily.     TRESIBA FLEXTOUCH 200 UNIT/ML FlexTouch Pen Inject 100 Units into the skin every morning.     No current facility-administered medications for this encounter.    Atrial Fibrillation Management history:  Previous antiarrhythmic drugs: none Previous cardioversions: 05/27/24 Previous ablations: none Anticoagulation history: Eliquis    ROS- All systems are reviewed and negative except as per the HPI above.  Physical Exam: BP 132/88   Pulse 93   Ht 6' 1 (1.854 m)   Wt 129.2 kg   BMI 37.57 kg/m   GEN- The patient is well appearing, alert and oriented x 3 today.   Neck - no JVD or carotid bruit noted Lungs- Clear to ausculation bilaterally, normal work of breathing Heart- Irregular rate and rhythm, no murmurs, rubs or gallops, PMI not  laterally displaced Extremities- no clubbing, cyanosis, or edema Skin - no rash or ecchymosis noted   EKG today demonstrates  Vent. rate 93 BPM PR interval * ms QRS duration 84 ms QT/QTcB 322/400 ms P-R-T axes * 56 30 Atrial fibrillation Abnormal ECG When compared with ECG of 27-May-2024 08:35, Atrial fibrillation has replaced Sinus rhythm  Echo 05/28/24: 1. Left ventricular ejection fraction, by estimation, is 45 to 50%. The  left ventricle has mildly decreased function. The left ventricle has no  regional wall motion abnormalities. There is mild concentric left  ventricular hypertrophy. Left ventricular  diastolic  function could not be evaluated.   2. Right ventricular systolic function is mildly reduced. The right  ventricular size is normal.   3. Left atrial size was mild to moderately dilated.   4. Right atrial size was moderately dilated.   5. The mitral valve is normal in structure. Mild mitral valve  regurgitation. No evidence of mitral stenosis.   6. The aortic valve is tricuspid. There is mild calcification of the  aortic valve. Aortic valve regurgitation is not visualized. No aortic  stenosis is present.   7. Aortic dilatation noted. There is borderline dilatation of the aortic  root, measuring 38 mm.   8. The inferior vena cava is normal in size with <50% respiratory  variability, suggesting right atrial pressure of 8 mmHg.   ASSESSMENT & PLAN CHA2DS2-VASc Score = 2  The patient's score is based upon: CHF History: 0 HTN History: 1 Diabetes History: 1 Stroke History: 0 Vascular Disease History: 0 Age Score: 0 Gender Score: 0       ASSESSMENT AND PLAN: Persistent Atrial Fibrillation (ICD10:  I48.19) The patient's CHA2DS2-VASc score is 2, indicating a 2.2% annual risk of stroke.   S/p successful DCCV on 05/27/24.  Patient is currently in Afib. Continue Lopressor  50 mg BID. Coreg stopped by Dr. Michele on 7/28 post procedure. He has had ERAF unfortunately and echocardiogram shows mildly decreased LV function. We will pursue aggressive rhythm control in this situation. I discussed options with patient and after discussion he will begin amiodarone  200 mg BID x 1 month then transition to 200 mg once daily. Will reassess in 2-3 weeks to determine if needs cardioversion. Will help establish care with EP to discuss ablation and use amiodarone  as a temporary bridge to procedure.     Secondary Hypercoagulable State (ICD10:  D68.69) The patient is at significant risk for stroke/thromboembolism based upon his CHA2DS2-VASc Score of 2.  Continue Apixaban  (Eliquis ).  No missed doses.      Follow up 2-3 weeks to determine if needs cardioversion.   Terra Pac, PA-C  Afib Clinic [facility] [address] [place], [place] [address] [phone]

## 2024-06-27 ENCOUNTER — Other Ambulatory Visit: Payer: Self-pay | Admitting: Cardiology

## 2024-06-27 NOTE — Telephone Encounter (Signed)
This is a A-Fib clinic pt 

## 2024-07-04 ENCOUNTER — Ambulatory Visit (HOSPITAL_COMMUNITY)
Admission: RE | Admit: 2024-07-04 | Discharge: 2024-07-04 | Disposition: A | Discharge status: Home / Self Care | Source: Ambulatory Visit | Attending: Internal Medicine | Admitting: Internal Medicine

## 2024-07-04 ENCOUNTER — Ambulatory Visit (HOSPITAL_COMMUNITY): Payer: Self-pay | Admitting: Internal Medicine

## 2024-07-04 ENCOUNTER — Other Ambulatory Visit (HOSPITAL_COMMUNITY): Payer: Self-pay | Admitting: *Deleted

## 2024-07-04 DIAGNOSIS — I4819 Other persistent atrial fibrillation: Secondary | ICD-10-CM | POA: Diagnosis not present

## 2024-07-04 DIAGNOSIS — Z79899 Other long term (current) drug therapy: Secondary | ICD-10-CM

## 2024-07-04 DIAGNOSIS — D6869 Other thrombophilia: Secondary | ICD-10-CM

## 2024-07-04 LAB — CBC
Hematocrit: 48 % (ref 37.5–51.0)
Hemoglobin: 15.1 g/dL (ref 13.0–17.7)
MCH: 26.9 pg (ref 26.6–33.0)
MCHC: 31.5 g/dL (ref 31.5–35.7)
MCV: 85 fL (ref 79–97)
Platelets: 280 x10E3/uL (ref 150–450)
RBC: 5.62 x10E6/uL (ref 4.14–5.80)
RDW: 14.6 % (ref 11.6–15.4)
WBC: 10.7 x10E3/uL (ref 3.4–10.8)

## 2024-07-04 LAB — BASIC METABOLIC PANEL WITH GFR
BUN/Creatinine Ratio: 24 — ABNORMAL HIGH (ref 9–20)
BUN: 29 mg/dL — ABNORMAL HIGH (ref 6–24)
CO2: 32 mmol/L — ABNORMAL HIGH (ref 20–29)
Calcium: 10 mg/dL (ref 8.7–10.2)
Chloride: 93 mmol/L — ABNORMAL LOW (ref 96–106)
Creatinine, Ser: 1.19 mg/dL (ref 0.76–1.27)
Glucose: 275 mg/dL — ABNORMAL HIGH (ref 70–99)
Potassium: 4.9 mmol/L (ref 3.5–5.2)
Sodium: 134 mmol/L (ref 134–144)
eGFR: 73 mL/min/1.73 (ref >59)

## 2024-07-04 NOTE — Progress Notes (Signed)
 Patient began amiodarone  load on 8/23. He will transition to once daily amiodarone  200 mg daily on 9/22. We discussed the procedure cardioversion to try to convert to NSR. We discussed the risks vs benefits of this procedure and how ultimately we cannot predict whether a patient will have early return of arrhythmia post procedure. After discussion, the patient wishes to proceed with cardioversion. Labs drawn today.     ECG today shows  Vent. rate 90 BPM PR interval * ms QRS duration 88 ms QT/QTcB 354/433 ms P-R-T axes * 56 33 Atrial fibrillation Abnormal ECG When compared with ECG of 20-Jun-2024 10:49, No significant change was found  Informed Consent   Shared Decision Making/Informed Consent The risks (stroke, cardiac arrhythmias rarely resulting in the need for a temporary or permanent pacemaker, skin irritation or burns and complications associated with conscious sedation including aspiration, arrhythmia, respiratory failure and death), benefits (restoration of normal sinus rhythm) and alternatives of a direct current cardioversion were explained in detail to Mr. Mahon and he agrees to proceed.       Follow up as scheduled with Dr. Almetta after DCCV.

## 2024-07-04 NOTE — Patient Instructions (Addendum)
 Hold Ozempic 9/14 until after procedure then may resume   Hold morning dose of metformin   Cardioversion scheduled for: 07/22/24 9:00am    - Arrive at the [employer] [facility] ([address]  and check in with ADMITTING at 9:00 am    - Do not eat or drink anything after midnight the night prior to your procedure.   - Take all your morning medication (except diabetic medications) with a sip of water prior to arrival.  - Do NOT miss any doses of your blood thinner - if you should miss a dose or take a dose more than 4 hours late -- please notify our office immediately.  - You will not be able to drive home after your procedure. Please ensure you have a responsible adult to drive you home. You will need someone with you for 24 hours post procedure.     - Expect to be in the procedural area approximately 2 hours.   - If you feel as if you go back into normal rhythm prior to scheduled cardioversion, please notify our office immediately.   If your procedure is canceled in the cardioversion suite you will be charged a cancellation fee.    Hold below medications 7 days prior to scheduled procedure/anesthesia.  Restart medication on the normal dosing day after scheduled procedure/anesthesia Semaglutide (Ozempic) [facility])     For those patients who have a scheduled procedure/anesthesia on the same day of the week as their dose, hold the medication on the day of surgery.  They can take their scheduled dose the week before.  **Patients on the above medications scheduled for elective procedures that have not held the medication for the appropriate amount of time are at risk of cancellation or change in the anesthetic plan.

## 2024-07-04 NOTE — Addendum Note (Signed)
 Encounter addended by: Janel Nancy SAUNDERS, RN on: 07/04/2024 1:19 PM  Actions taken: Clinical Note Signed

## 2024-07-18 NOTE — Progress Notes (Addendum)
 Cardiology Office Note   Date:  07/19/2024  ID:  Larry Hammond, DOB [birth year], MRN [id number] PCP: Evangelina Tinnie Norris, PA-C  [facility] Providers Electrophysiologist: Adina Primus, MD   History of Present Illness Larry Hammond is a 53 y.o. male with persistent AF/RVR, HTN, DM2, OSA, and cataracts who presents for evaluation of AF management.  He was seen in the emergency department on 05/02/2024 for new onset AF/RVR after going to an outpatient cataract surgery appointment.  He was prescribed metoprolol  to tartrate 25 mg twice daily and started on Eliquis  5 mg twice daily.  He underwent successful cardioversion on 05/27/2024 and TTE on the following day with mildly reduced LVEF of 45-50%.  Unfortunately this only lasted for short time and he went back into AF.  He did notice a significant improvement in shortness of breath and dyspnea on exertion when in normal rhythm.  He was started on amiodarone  200 mg twice daily x 1 month followed by 200 mg daily thereafter on 06/20/2024.  Scheduled for de novo PVI with Fara pulse/cardio on 10/24/Friday. Symptomatic AF with shortness of breath/dyspnea on exertion.  Planning for cardioversion on Monday.  Uninterrupted anticoagulation and Amio.  ROS: positive for SOB, generalized malaise   Studies Reviewed  TTE  Result date: 05/28/24  1. Left ventricular ejection fraction, by estimation, is 45 to 50%. The  left ventricle has mildly decreased function. The left ventricle has no  regional wall motion abnormalities. There is mild concentric left  ventricular hypertrophy. Left ventricular  diastolic function could not be evaluated.   2. Right ventricular systolic function is mildly reduced. The right  ventricular size is normal.   3. Left atrial size was mild to moderately dilated.   4. Right atrial size was moderately dilated.   5. The mitral valve is normal in structure. Mild mitral valve  regurgitation. No evidence of mitral stenosis.    6. The aortic valve is tricuspid. There is mild calcification of the  aortic valve. Aortic valve regurgitation is not visualized. No aortic  stenosis is present.   7. Aortic dilatation noted. There is borderline dilatation of the aortic  root, measuring 38 mm.   8. The inferior vena cava is normal in size with <50% respiratory  variability, suggesting right atrial pressure of 8 mmHg.   ECG  Result date: 07/19/24 AF/VR 84, QRS 94, QT/c 370/437  Risk Assessment/Calculations  CHA2DS2-VASc Score = 2  This indicates a 2.2% annual risk of stroke. The patient's score is based upon: CHF History: 0 HTN History: 1 Diabetes History: 1 Stroke History: 0 Vascular Disease History: 0 Age Score: 0 Gender Score: 0      Physical Exam VS:  BP 126/76 (BP Location: Right Arm, Patient Position: Sitting, Cuff Size: Normal)   Pulse 84   Ht 6' (1.829 m)   Wt 283 lb (128.4 kg)   SpO2 99%   BMI 38.38 kg/m        Wt Readings from Last 3 Encounters:  07/19/24 283 lb (128.4 kg)  06/20/24 284 lb 12.8 oz (129.2 kg)  05/27/24 280 lb (127 kg)    GEN: Well nourished, well developed in no acute distress NECK: No JVD; No carotid bruits CARDIAC: irregular rhythm, regular rate, no murmurs, rubs, gallops RESPIRATORY:  Clear to auscultation without rales, wheezing or rhonchi  ABDOMEN: Soft, non-tender, non-distended EXTREMITIES:  No edema; No deformity   ASSESSMENT AND PLAN Larry Hammond is a 53 y.o. male with persistent AF/RVR, HTN, DM2, OSA, and  cataracts who presents for evaluation of AF management.   Symptomatic Persistent AF Plan for de novo PVI+PWI for persistent AF. Will likely continue amio for 30 days post procedure.  We reviewed different options for management of his AF including rhythm control versus ablation.  His QTc is reasonable for dofetilide.  He would preference proceeding with catheter ablation to reduce his AF burden.  He is scheduled for repeat cardioversion on Monday after several  weeks of amiodarone .  His last cardioversion only lasted a few days.  Hopefully he will maintain sinus rhythm leading up to ablation.    Discussed treatment options today for AF including antiarrhythmic drug therapy and ablation. Discussed risks, recovery and likelihood of success with each treatment strategy. Risk, benefits, and alternatives to EP study and ablation for afib were discussed. These risks include but are not limited to stroke, bleeding, vascular damage, tamponade, perforation, damage to the esophagus, lungs, phrenic nerve and other structures, pulmonary vein stenosis, worsening renal function, coronary vasospasm and death.  Discussed potential need for repeat ablation procedures and antiarrhythmic drugs after an initial ablation. The patient understands these risk and wishes to proceed.  We will therefore proceed with catheter ablation on Friday, 08/23/24. Carto, ICE, anesthesia are requested for the procedure.    Pre procedure details: ICE/Carto/Farapulse. No CT scan needed. Apixaban  hold 08/23/24 AM dose, last dose to give 10/23 PM. Hold Ozempic dose the week prior. Continue all meds the day prior. Hold all meds the morning of procedure. Plan for same day discharge.   Dispo: schedule ablation as above, will f/u post ablation   A total of 117 minutes was spent preparing for the patient, reviewing history, performing exam, and document encounter along with counseling the patient.  45 minutes of which was spent with direct patient care.  Signed, [name], MD

## 2024-07-18 NOTE — H&P (View-Only) (Signed)
 Cardiology Office Note   Date:  07/19/2024  ID:  Savas Elvin, DOB [birth year], MRN [id number] PCP: Evangelina Tinnie Norris, PA-C  [facility] Providers Electrophysiologist: Adina Primus, MD   History of Present Illness Larry Hammond is a 53 y.o. male with persistent AF/RVR, HTN, DM2, OSA, and cataracts who presents for evaluation of AF management.  He was seen in the emergency department on 05/02/2024 for new onset AF/RVR after going to an outpatient cataract surgery appointment.  He was prescribed metoprolol  to tartrate 25 mg twice daily and started on Eliquis  5 mg twice daily.  He underwent successful cardioversion on 05/27/2024 and TTE on the following day with mildly reduced LVEF of 45-50%.  Unfortunately this only lasted for short time and he went back into AF.  He did notice a significant improvement in shortness of breath and dyspnea on exertion when in normal rhythm.  He was started on amiodarone  200 mg twice daily x 1 month followed by 200 mg daily thereafter on 06/20/2024.  Scheduled for de novo PVI with Fara pulse/cardio on 10/24/Friday. Symptomatic AF with shortness of breath/dyspnea on exertion.  Planning for cardioversion on Monday.  Uninterrupted anticoagulation and Amio.  ROS: positive for SOB, generalized malaise   Studies Reviewed  TTE  Result date: 05/28/24  1. Left ventricular ejection fraction, by estimation, is 45 to 50%. The  left ventricle has mildly decreased function. The left ventricle has no  regional wall motion abnormalities. There is mild concentric left  ventricular hypertrophy. Left ventricular  diastolic function could not be evaluated.   2. Right ventricular systolic function is mildly reduced. The right  ventricular size is normal.   3. Left atrial size was mild to moderately dilated.   4. Right atrial size was moderately dilated.   5. The mitral valve is normal in structure. Mild mitral valve  regurgitation. No evidence of mitral stenosis.    6. The aortic valve is tricuspid. There is mild calcification of the  aortic valve. Aortic valve regurgitation is not visualized. No aortic  stenosis is present.   7. Aortic dilatation noted. There is borderline dilatation of the aortic  root, measuring 38 mm.   8. The inferior vena cava is normal in size with <50% respiratory  variability, suggesting right atrial pressure of 8 mmHg.   ECG  Result date: 07/19/24 AF/VR 84, QRS 94, QT/c 370/437  Risk Assessment/Calculations  CHA2DS2-VASc Score = 2  This indicates a 2.2% annual risk of stroke. The patient's score is based upon: CHF History: 0 HTN History: 1 Diabetes History: 1 Stroke History: 0 Vascular Disease History: 0 Age Score: 0 Gender Score: 0      Physical Exam VS:  BP 126/76 (BP Location: Right Arm, Patient Position: Sitting, Cuff Size: Normal)   Pulse 84   Ht 6' (1.829 m)   Wt 283 lb (128.4 kg)   SpO2 99%   BMI 38.38 kg/m        Wt Readings from Last 3 Encounters:  07/19/24 283 lb (128.4 kg)  06/20/24 284 lb 12.8 oz (129.2 kg)  05/27/24 280 lb (127 kg)    GEN: Well nourished, well developed in no acute distress NECK: No JVD; No carotid bruits CARDIAC: irregular rhythm, regular rate, no murmurs, rubs, gallops RESPIRATORY:  Clear to auscultation without rales, wheezing or rhonchi  ABDOMEN: Soft, non-tender, non-distended EXTREMITIES:  No edema; No deformity   ASSESSMENT AND PLAN [name] is a 53 y.o. male with persistent AF/RVR, HTN, DM2, OSA, and  cataracts who presents for evaluation of AF management.   Symptomatic Persistent AF Plan for de novo PVI+PWI for persistent AF. Will likely continue amio for 30 days post procedure.  We reviewed different options for management of his AF including rhythm control versus ablation.  His QTc is reasonable for dofetilide.  He would preference proceeding with catheter ablation to reduce his AF burden.  He is scheduled for repeat cardioversion on Monday after several  weeks of amiodarone .  His last cardioversion only lasted a few days.  Hopefully he will maintain sinus rhythm leading up to ablation.    Discussed treatment options today for AF including antiarrhythmic drug therapy and ablation. Discussed risks, recovery and likelihood of success with each treatment strategy. Risk, benefits, and alternatives to EP study and ablation for afib were discussed. These risks include but are not limited to stroke, bleeding, vascular damage, tamponade, perforation, damage to the esophagus, lungs, phrenic nerve and other structures, pulmonary vein stenosis, worsening renal function, coronary vasospasm and death.  Discussed potential need for repeat ablation procedures and antiarrhythmic drugs after an initial ablation. The patient understands these risk and wishes to proceed.  We will therefore proceed with catheter ablation on Friday, 08/23/24. Carto, ICE, anesthesia are requested for the procedure.    Pre procedure details: ICE/Carto/Farapulse. No CT scan needed. Apixaban  hold 08/23/24 AM dose, last dose to give 10/23 PM. Hold Ozempic dose the week prior. Continue all meds the day prior. Hold all meds the morning of procedure. Plan for same day discharge.   Dispo: schedule ablation as above, will f/u post ablation   A total of 117 minutes was spent preparing for the patient, reviewing history, performing exam, and document encounter along with counseling the patient.  45 minutes of which was spent with direct patient care.  Signed, [name], MD

## 2024-07-19 ENCOUNTER — Encounter: Payer: Self-pay | Admitting: Student in an Organized Health Care Education/Training Program

## 2024-07-19 ENCOUNTER — Ambulatory Visit
Attending: Student in an Organized Health Care Education/Training Program | Admitting: Student in an Organized Health Care Education/Training Program

## 2024-07-19 VITALS — BP 126/76 | HR 84 | Ht 72.0 in | Wt 283.0 lb

## 2024-07-19 DIAGNOSIS — Z01812 Encounter for preprocedural laboratory examination: Secondary | ICD-10-CM | POA: Diagnosis not present

## 2024-07-19 DIAGNOSIS — I4819 Other persistent atrial fibrillation: Secondary | ICD-10-CM

## 2024-07-19 NOTE — Patient Instructions (Signed)
 Medication Instructions:  Your physician recommends that you continue on your current medications as directed. Please refer to the Current Medication list given to you today.  *If you need a refill on your cardiac medications before your next appointment, please call your pharmacy*  Lab Work: CBC and BMET - please have pre-procedure lab work completed on Wednesday,07/31/2024 . This can be done at ANY [facility] near you - no appointment required and this does not have to be fasting. If you have labs (blood work) drawn today and your tests are completely normal, you will receive your results only by: MyChart Message (if you have MyChart) OR A paper copy in the mail If you have any lab test that is abnormal or we need to change your treatment, we will call you to review the results.  Testing/Procedures:  Atrial Fibrillation Ablation - scheduled on 08/23/2024.  We will be in contact closer to your ablation date with further instructions Your physician has recommended that you have an ablation. Catheter ablation is a medical procedure used to treat some cardiac arrhythmias (irregular heartbeats). During catheter ablation, a long, thin, flexible tube is put into a blood vessel in your groin (upper thigh), or neck. This tube is called an ablation catheter. It is then guided to your heart through the blood vessel. Radio frequency waves destroy small areas of heart tissue where abnormal heartbeats may cause an arrhythmia to start. Please see the instruction sheet given to you today.  Follow-Up: At [facility], you and your health needs are our priority.  As part of our continuing mission to provide you with exceptional heart care, our providers are all part of one team.  This team includes your primary Cardiologist (physician) and Advanced Practice Providers or APPs (Physician Assistants and Nurse Practitioners) who all work together to provide you with the care you need, when you need it.  Your  next appointment:   We will schedule follow up after your ablation  Provider:   Dr Almetta   Cardiac Ablation Cardiac ablation is a procedure to destroy, or ablate, a small amount of heart tissue that is causing problems. The heart has many electrical connections. Sometimes, these connections are abnormal and can cause the heart to beat very fast or irregularly. Ablating the abnormal areas can improve the heart's rhythm or return it to normal. Ablation may be done for people who: Have irregular or rapid heartbeats (arrhythmias). Have Wolff-Parkinson-White syndrome. Have taken medicines for an arrhythmia that did not work or caused side effects. Have a high-risk heartbeat that may be life-threatening. Tell a health care provider about: Any allergies you have. All medicines you are taking, including vitamins, herbs, eye drops, creams, and over-the-counter medicines. Any problems you or family members have had with anesthesia. Any bleeding problems you have. Any surgeries you have had. Any medical conditions you have. Whether you are pregnant or may be pregnant. What are the risks? Your health care provider will talk with you about risks. These may include: Infection. Bruising and bleeding. Stroke or blood clots. Damage to nearby structures or organs. Allergic reaction to medicines or dyes. Needing a pacemaker if the heart gets damaged. A pacemaker is a device that helps the heart beat normally. Failure of the procedure. A repeat procedure may be needed. What happens before the procedure? Medicines Ask your health care provider about: Changing or stopping your regular medicines. These include any heart rhythm medicines, diabetes medicines, or blood thinners you take. Taking medicines such as aspirin  and ibuprofen. These medicines can thin your blood. Do not take them unless your health care provider tells you to. Taking over-the-counter medicines, vitamins, herbs, and  supplements. General instructions Follow instructions from your health care provider about what you may eat and drink. If you will be going home right after the procedure, plan to have a responsible adult: Take you home from the hospital or clinic. You will not be allowed to drive. Care for you for the time you are told. Ask your health care provider what steps will be taken to prevent infection. What happens during the procedure?  An IV will be inserted into one of your veins. You may be given: A sedative. This helps you relax. Anesthesia. This will: Numb certain areas of your body. An incision will be made in your neck or your groin. A needle will be inserted through the incision and into a large vein in your neck or groin. The small, thin tube (catheter) will be inserted through the needle and moved to your heart. A type of X-ray (fluoroscopy) will be used to help guide the catheter and provide images of the heart on a monitor. Dye may be injected through the catheter to help your surgeon see the area of the heart that needs treatment. Electrical currents will be sent from the catheter to destroy heart tissue in certain areas. There are three types of energy that may be used to do this: Heat (radiofrequency energy). Laser energy. Extreme cold (cryoablation). When the tissue has been destroyed, the catheter will be removed. Pressure will be held on the insertion area to prevent bleeding. A bandage (dressing) will be placed over the insertion area. The procedure may vary among health care providers and hospitals. What happens after the procedure? Your blood pressure, heart rate and rhythm, breathing rate, and blood oxygen level will be monitored until you leave the hospital or clinic. Your insertion area will be checked for bleeding. You will need to lie still for a few hours. If your groin was used, you will need to keep your leg straight for a few hours after the catheter is  removed. This information is not intended to replace advice given to you by your health care provider. Make sure you discuss any questions you have with your health care provider. Document Revised: 04/05/2022 Document Reviewed: 04/05/2022 Elsevier Patient Education  2024 [employer].

## 2024-07-19 NOTE — Progress Notes (Signed)
 Spoke to patient and instructed them to come at 0900  and to be NPO after 0000.     Confirmed that patient will have a ride home and someone to stay with them for 24 hours after the procedure.   Medications reviewed.  Confirmed blood thinner.  Confirmed no breaks in taking blood thinner for 3+ weeks prior to procedure. Confirmed patient stopped all GLP-1s and GLP-2s for at least one week before procedure.

## 2024-07-21 NOTE — Anesthesia Preprocedure Evaluation (Signed)
 Anesthesia Evaluation  Patient identified by MRN, date of birth, ID band Patient awake    Reviewed: Allergy & Precautions, NPO status , Patient's Chart, lab work & pertinent test results, reviewed documented beta blocker date and time   Airway Mallampati: I  TM Distance: >3 FB Neck ROM: Full    Dental  (+) Dental Advisory Given, Missing, Chipped, Poor Dentition   Pulmonary neg pulmonary ROS   Pulmonary exam normal breath sounds clear to auscultation       Cardiovascular hypertension, Pt. on medications and Pt. on home beta blockers Normal cardiovascular exam+ dysrhythmias (eliquis ) Atrial Fibrillation  Rhythm:Irregular Rate:Normal  TTE 2025 1. Left ventricular ejection fraction, by estimation, is 45 to 50%. The  left ventricle has mildly decreased function. The left ventricle has no  regional wall motion abnormalities. There is mild concentric left  ventricular hypertrophy. Left ventricular  diastolic function could not be evaluated.   2. Right ventricular systolic function is mildly reduced. The right  ventricular size is normal.   3. Left atrial size was mild to moderately dilated.   4. Right atrial size was moderately dilated.   5. The mitral valve is normal in structure. Mild mitral valve  regurgitation. No evidence of mitral stenosis.   6. The aortic valve is tricuspid. There is mild calcification of the  aortic valve. Aortic valve regurgitation is not visualized. No aortic  stenosis is present.   7. Aortic dilatation noted. There is borderline dilatation of the aortic  root, measuring 38 mm.   8. The inferior vena cava is normal in size with <50% respiratory  variability, suggesting right atrial pressure of 8 mmHg.      Neuro/Psych  PSYCHIATRIC DISORDERS Anxiety Depression    negative neurological ROS     GI/Hepatic negative GI ROS, Neg liver ROS,,,  Endo/Other  diabetes, Type 2    Renal/GU negative Renal ROS   negative genitourinary   Musculoskeletal negative musculoskeletal ROS (+)    Abdominal   Peds  Hematology negative hematology ROS (+)   Anesthesia Other Findings   Reproductive/Obstetrics                              Anesthesia Physical Anesthesia Plan  ASA: 3  Anesthesia Plan: General   Post-op Pain Management:    Induction: Intravenous  PONV Risk Score and Plan: Propofol  infusion and Treatment may vary due to age or medical condition  Airway Management Planned: Natural Airway  Additional Equipment:   Intra-op Plan:   Post-operative Plan:   Informed Consent: I have reviewed the patients History and Physical, chart, labs and discussed the procedure including the risks, benefits and alternatives for the proposed anesthesia with the patient or authorized representative who has indicated his/her understanding and acceptance.     Dental advisory given  Plan Discussed with: CRNA  Anesthesia Plan Comments:          Anesthesia Quick Evaluation

## 2024-07-22 ENCOUNTER — Ambulatory Visit (HOSPITAL_BASED_OUTPATIENT_CLINIC_OR_DEPARTMENT_OTHER): Admitting: Anesthesiology

## 2024-07-22 ENCOUNTER — Ambulatory Visit (HOSPITAL_COMMUNITY)
Admission: RE | Admit: 2024-07-22 | Discharge: 2024-07-22 | Disposition: A | Discharge status: Home / Self Care | Attending: Cardiology | Admitting: Cardiology

## 2024-07-22 ENCOUNTER — Other Ambulatory Visit: Payer: Self-pay

## 2024-07-22 ENCOUNTER — Ambulatory Visit (HOSPITAL_COMMUNITY): Admitting: Anesthesiology

## 2024-07-22 ENCOUNTER — Encounter (HOSPITAL_COMMUNITY)
Admission: RE | Disposition: A | Discharge status: Home / Self Care | Payer: Self-pay | Source: Home / Self Care | Attending: Cardiology

## 2024-07-22 DIAGNOSIS — G4733 Obstructive sleep apnea (adult) (pediatric): Secondary | ICD-10-CM | POA: Insufficient documentation

## 2024-07-22 DIAGNOSIS — I4891 Unspecified atrial fibrillation: Secondary | ICD-10-CM

## 2024-07-22 DIAGNOSIS — E119 Type 2 diabetes mellitus without complications: Secondary | ICD-10-CM

## 2024-07-22 DIAGNOSIS — I4819 Other persistent atrial fibrillation: Secondary | ICD-10-CM | POA: Diagnosis not present

## 2024-07-22 DIAGNOSIS — I10 Essential (primary) hypertension: Secondary | ICD-10-CM | POA: Insufficient documentation

## 2024-07-22 DIAGNOSIS — F418 Other specified anxiety disorders: Secondary | ICD-10-CM

## 2024-07-22 DIAGNOSIS — E1136 Type 2 diabetes mellitus with diabetic cataract: Secondary | ICD-10-CM | POA: Diagnosis not present

## 2024-07-22 DIAGNOSIS — Z7901 Long term (current) use of anticoagulants: Secondary | ICD-10-CM | POA: Insufficient documentation

## 2024-07-22 LAB — GLUCOSE, CAPILLARY: Glucose-Capillary: 266 mg/dL — ABNORMAL HIGH (ref 70–99)

## 2024-07-22 SURGERY — CARDIOVERSION (CATH LAB)
Anesthesia: General

## 2024-07-22 MED ORDER — LIDOCAINE 2% (20 MG/ML) 5 ML SYRINGE
INTRAMUSCULAR | Status: DC | PRN
  Administered 2024-07-22: 100 mg via INTRAVENOUS

## 2024-07-22 MED ORDER — SODIUM CHLORIDE 0.9 % IV SOLN: INTRAVENOUS | Status: DC

## 2024-07-22 MED ORDER — PROPOFOL 10 MG/ML IV BOLUS
INTRAVENOUS | Status: DC | PRN
  Administered 2024-07-22: 100 mg via INTRAVENOUS

## 2024-07-22 SURGICAL SUPPLY — 1 items: PAD DEFIB RADIO PHYSIO CONN (PAD) ×1 IMPLANT

## 2024-07-22 NOTE — Interval H&P Note (Signed)
 History and Physical Interval Note:  07/22/2024 10:36 AM  Larry Hammond  has presented today for surgery, with the diagnosis of AFIB.  The various methods of treatment have been discussed with the patient and family. After consideration of risks, benefits and other options for treatment, the patient has consented to  Procedure(s): CARDIOVERSION (N/A) as a surgical intervention.  The patient's history has been reviewed, patient examined, no change in status, stable for surgery.  I have reviewed the patient's chart and labs.  Questions were answered to the patient's satisfaction.     [name]

## 2024-07-22 NOTE — Transfer of Care (Signed)
 Immediate Anesthesia Transfer of Care Note  Patient: Larry Hammond  Procedure(s) Performed: CARDIOVERSION  Patient Location: PACU and Cath Lab  Anesthesia Type:MAC  Level of Consciousness: awake and alert   Airway & Oxygen Therapy: Patient Spontanous Breathing and Patient connected to nasal cannula oxygen  Post-op Assessment: Report given to RN and Post -op Vital signs reviewed and stable  Post vital signs: Reviewed, stable  Hammond Vitals:  Vitals Value Taken Time  BP 120/109   Temp    Pulse 76   Resp 20   SpO2 99     Hammond Pain:  Vitals:   07/22/24 0916  TempSrc:   PainSc: 0-No pain         Complications: No notable events documented.

## 2024-07-22 NOTE — CV Procedure (Signed)
 Procedure:   DCCV  Indication:  Symptomatic atrial fibrillation  Procedure Note:  The patient signed informed consent.  They have had had therapeutic anticoagulation with ELiquis  greater than 3 weeks.  Anesthesia was administered by Dr. Niels and Jackee Peng, CRNA.  Adequate airway was maintained throughout and vital followed per protocol.  They were cardioverted x 1 with 200J of biphasic synchronized energy.  They converted to NSR with rate 70s.  There were no apparent complications.  The patient had normal neuro status and respiratory status post procedure with vitals stable as recorded elsewhere.    Follow up:  They will continue on current medical therapy and follow up with cardiology as scheduled.  [name], MD 07/22/2024 10:53 AM

## 2024-07-23 ENCOUNTER — Encounter (HOSPITAL_COMMUNITY): Payer: Self-pay | Admitting: Cardiology

## 2024-07-23 NOTE — Anesthesia Postprocedure Evaluation (Signed)
 Anesthesia Post Note  Patient: Larry Hammond  Procedure(s) Performed: CARDIOVERSION     Patient location during evaluation: PACU Anesthesia Type: General Level of consciousness: awake and alert Pain management: pain level controlled Vital Signs Assessment: post-procedure vital signs reviewed and stable Respiratory status: spontaneous breathing, nonlabored ventilation, respiratory function stable and patient connected to nasal cannula oxygen Cardiovascular status: blood pressure returned to baseline and stable Postop Assessment: no apparent nausea or vomiting Anesthetic complications: no   No notable events documented.  Last Vitals:  Vitals:   07/22/24 1125 07/22/24 1130  BP: (!) 147/91 (!) 147/91  Pulse: 77 77  Resp: 13 17  Temp:    SpO2: 97% 97%    Last Pain:  Vitals:   07/22/24 1110  TempSrc:   PainSc: 0-No pain                 Larry Hammond

## 2024-07-24 ENCOUNTER — Telehealth: Payer: Self-pay

## 2024-07-24 NOTE — Telephone Encounter (Signed)
Work up complete. 

## 2024-07-24 NOTE — Telephone Encounter (Signed)
-----   Message from Nurse Aldona R sent at 07/23/2024  4:49 PM EDT ----- Regarding: 10/24 Afib Ablation Almetta Important: list procedure date as first item in subject line, followed by procedure type (e.g., 07/12/24 AFib ablation)  Precert:  MD: Almetta Type of ablation: A-fib Diagnosis: Afib CPT code: A-fib (06343) Ablation scheduled (date/time): 10/24 10am  Procedure:  Added to calendar? Yes Orders entered? No, >30 days before procedure Letter complete? No, >30 days before procedure Scheduled with cath lab? Yes Any medications to hold? No Labs ordered (CBC, BMET, PT/INR if on warfarin): Yes Mapping system: CARTO (lab 4 or 6) CARTO/OPAL rep notified? Yes Cardiac CT needed? No Dye allergy? N/A Pre-meds ordered and instructions given? No, >30 days before procedure Letter method: Mail H&P: 9/19 Device: No  Follow-up:  Cassie/Angel, please schedule Routine.  Covering RN - please send this message to [employer], EP scheduler, EP Scheduling pool, EP [employer], and CT scheduler ([place] Lynch/Stephanie Mogg), if indicated.

## 2024-08-01 ENCOUNTER — Telehealth (HOSPITAL_COMMUNITY): Payer: Self-pay

## 2024-08-01 ENCOUNTER — Encounter (HOSPITAL_COMMUNITY): Payer: Self-pay

## 2024-08-01 NOTE — Telephone Encounter (Signed)
 Attempted to reach patient to discuss upcoming procedure, no answer. Left VM for patient to return call.

## 2024-08-02 ENCOUNTER — Ambulatory Visit (HOSPITAL_COMMUNITY): Admitting: Internal Medicine

## 2024-08-02 ENCOUNTER — Encounter: Payer: Self-pay | Admitting: Cardiology

## 2024-08-02 ENCOUNTER — Ambulatory Visit: Attending: Cardiology | Admitting: Cardiology

## 2024-08-02 VITALS — BP 158/94 | HR 73 | Ht 73.0 in | Wt 280.0 lb

## 2024-08-02 DIAGNOSIS — I4819 Other persistent atrial fibrillation: Secondary | ICD-10-CM

## 2024-08-02 DIAGNOSIS — E782 Mixed hyperlipidemia: Secondary | ICD-10-CM

## 2024-08-02 DIAGNOSIS — G4730 Sleep apnea, unspecified: Secondary | ICD-10-CM

## 2024-08-02 DIAGNOSIS — Z794 Long term (current) use of insulin: Secondary | ICD-10-CM

## 2024-08-02 DIAGNOSIS — D6869 Other thrombophilia: Secondary | ICD-10-CM

## 2024-08-02 DIAGNOSIS — I10 Essential (primary) hypertension: Secondary | ICD-10-CM | POA: Diagnosis not present

## 2024-08-02 DIAGNOSIS — E1165 Type 2 diabetes mellitus with hyperglycemia: Secondary | ICD-10-CM

## 2024-08-02 NOTE — Patient Instructions (Signed)
 Medication Instructions:  Your physician recommends that you continue on your current medications as directed. Please refer to the Current Medication list given to you today.  *If you need a refill on your cardiac medications before your next appointment, please call your pharmacy*  Lab Work: None ordered today If you have labs (blood work) drawn today and your tests are completely normal, you will receive your results only by: MyChart Message (if you have MyChart) OR A paper copy in the mail If you have any lab test that is abnormal or we need to change your treatment, we will call you to review the results.  Testing/Procedures: Your physician has requested that you have an echocardiogram in February. Echocardiography is a painless test that uses sound waves to create images of your heart. It provides your doctor with information about the size and shape of your heart and how well your heart's chambers and valves are working. This procedure takes approximately one hour. There are no restrictions for this procedure. Please do NOT wear cologne, perfume, aftershave, or lotions (deodorant is allowed). Please arrive 15 minutes prior to your appointment time.  Please note: We ask at that you not bring children with you during ultrasound (echo/ vascular) testing. Due to room size and safety concerns, children are not allowed in the ultrasound rooms during exams. Our front office staff cannot provide observation of children in our lobby area while testing is being conducted. An adult accompanying a patient to their appointment will only be allowed in the ultrasound room at the discretion of the ultrasound technician under special circumstances. We apologize for any inconvenience.   Follow-Up: At [facility], you and your health needs are our priority.  As part of our continuing mission to provide you with exceptional heart care, our providers are all part of one team.  This team includes your  primary Cardiologist (physician) and Advanced Practice Providers or APPs (Physician Assistants and Nurse Practitioners) who all work together to provide you with the care you need, when you need it.  Your next appointment:   5 month(s)  Provider:   Madonna Large, DO

## 2024-08-02 NOTE — Progress Notes (Signed)
 Cardiology Office Note:    Date:  08/02/2024  NAME:  Larry Hammond    MRN: [id number] DOB:  06-03-1971   PCP:  Evangelina Tinnie Norris, PA-C  Former Cardiology Providers: N/A Primary Cardiologist:  Madonna Large, DO, [facility] (established care 08/02/2024) Electrophysiologist:  None   Referring MD: Evangelina Tinnie Brier*  Reason of Consult: Establish care, new onset of A-fib  Chief Complaint  Patient presents with   Follow-up    Establish care, atrial fibrillation    History of Present Illness:    Larry Hammond is a 53 y.o. Caucasian male whose past medical history and cardiovascular risk factors includes: Persistent atrial fibrillation status post cardioversion, hypertension, diabetes mellitus type 2, OSA. He is being seen today to establish care at the request of Evangelina Tinnie Brier*.  In July 2025 patient presented to the ED for new onset of atrial fibrillation with rapid ventricular rate which was discovered as part of his outpatient cataract surgery.  He was started on metoprolol  to tartrate and oral anticoagulation and was referred to A-fib clinic for further evaluation and management.  Patient underwent successful direct-current cardioversion and May 27, 2024 and follow-up echo noted mildly reduced LVEF.  After his cardioversion patient had early return of A-fib.  While he was in normal sinus rhythm patient noted a significant improvement in shortness of breath.  Given his symptomatic A-fib rhythm control strategy was recommended.  Patient was started on amiodarone  and referred to EP for discussion of possible atrial fibrillation ablation.  Patient saw Dr. Almetta on 07/19/2024 and the shared decision was to proceed with atrial fibrillation ablation for symptomatic persistent A-fib.  I am seeing him for the first time in the office to establish care.  Denies anginal chest pain or heart failure symptoms. Ambulates with a cane on a regular basis given his underlying diabetic  neuropathy Endorses compliance with his device therapy given his sleep apnea. Scheduled for atrial fibrillation ablation later this month  Current Medications: Current Meds  Medication Sig   amiodarone  (PACERONE ) 200 MG tablet Take 1 tablet (200 mg total) by mouth 2 (two) times daily for 30 days, THEN 1 tablet (200 mg total) daily. (Patient taking differently: Take 1 tablet (200 mg total) daily.)   apixaban  (ELIQUIS ) 5 MG TABS tablet Take 1 tablet (5 mg total) by mouth 2 (two) times daily.   buPROPion (WELLBUTRIN XL) 300 MG 24 hr tablet Take 300 mg by mouth daily.   diclofenac (VOLTAREN) 75 MG EC tablet Take 75 mg by mouth 2 (two) times daily.   DULoxetine (CYMBALTA) 30 MG capsule Take 30 mg by mouth daily.   DULoxetine (CYMBALTA) 60 MG capsule Take 60 mg by mouth daily.   lisinopril-hydrochlorothiazide (ZESTORETIC) 20-25 MG tablet Take 1 tablet by mouth daily.   Magnesium  400 MG CAPS Take 400 mg by mouth daily.   metFORMIN (GLUCOPHAGE) 1000 MG tablet Take 1,000 mg by mouth 2 (two) times daily with a meal.   metoprolol  tartrate (LOPRESSOR ) 50 MG tablet Take 1 tablet (50 mg total) by mouth 2 (two) times daily. Hold if systolic blood pressure (top number) less than 100 mmHg or pulse less than 55 bpm   omeprazole (PRILOSEC OTC) 20 MG tablet Take 40 mg by mouth daily.   OZEMPIC, 1 MG/DOSE, 4 MG/3ML SOPN Inject 1 mg into the skin once a week.   pregabalin (LYRICA) 150 MG capsule Take 150 mg by mouth 2 (two) times daily.   rosuvastatin (CRESTOR) 20 MG tablet Take 20  mg by mouth daily.   TRESIBA FLEXTOUCH 200 UNIT/ML FlexTouch Pen Inject 100 Units into the skin every morning.     Allergies:    Patient has no known allergies.   Past Medical History: Past Medical History:  Diagnosis Date   Anxiety    Chronic back pain    DDD (degenerative disc disease), lumbar    Depression    Diabetes mellitus without complication (HCC)    Hyperlipemia    Hypertension     Past Surgical History: Past  Surgical History:  Procedure Laterality Date   CARDIOVERSION N/A 05/27/2024   Procedure: CARDIOVERSION;  Surgeon: Michele Richardson, DO;  Location: [facility] INVASIVE CV LAB;  Service: Cardiovascular;  Laterality: N/A;   CARDIOVERSION N/A 07/22/2024   Procedure: CARDIOVERSION;  Surgeon: Kate Lonni CROME, MD;  Location: [facility] INVASIVE CV LAB;  Service: Cardiovascular;  Laterality: N/A;   HERNIA REPAIR     MEDIAL COLLATERAL LIGAMENT REPAIR, KNEE      Social History: Social History   Tobacco Use   Smoking status: Never   Smokeless tobacco: Former   Tobacco comments:    Former chew 06/20/24  Vaping Use   Vaping status: Never Used  Substance Use Topics   Alcohol use: Yes   Drug use: Not Currently    Family History: History reviewed. No pertinent family history.  ROS:   Review of Systems  Cardiovascular:  Negative for chest pain, claudication, irregular heartbeat, leg swelling, near-syncope, orthopnea, palpitations, paroxysmal nocturnal dyspnea and syncope.  Respiratory:  Negative for shortness of breath.   Hematologic/Lymphatic: Negative for bleeding problem.    Risk Assessment/Calculations:   Click Here to Calculate/Change CHADS2VASc Score The patient's CHADS2-VASc score is 2, indicating a 2.2% annual risk of stroke.   CHF History: No HTN History: Yes Diabetes History: Yes Stroke History: No Vascular Disease History: No  EKGs/Labs/Other Studies Reviewed:   EKG: EKG Interpretation Date/Time:  Friday August 02 2024 09:03:12 EDT Ventricular Rate:  73 PR Interval:  188 QRS Duration:  86 QT Interval:  382 QTC Calculation: 420 R Axis:   61  Text Interpretation: Sinus rhythm with occasional Premature ventricular complexes When compared with ECG of 22-Jul-2024 10:59, Premature ventricular complexes are now Present Confirmed by Michele Richardson [phone]) on 08/02/2024 9:28:31 AM  Echocardiogram: 05/28/2024  1. Left ventricular ejection fraction, by estimation, is 45 to 50%. The  left  ventricle has mildly decreased function. The left ventricle has no  regional wall motion abnormalities. There is mild concentric left  ventricular hypertrophy. Left ventricular  diastolic function could not be evaluated.   2. Right ventricular systolic function is mildly reduced. The right  ventricular size is normal.   3. Left atrial size was mild to moderately dilated.   4. Right atrial size was moderately dilated.   5. The mitral valve is normal in structure. Mild mitral valve  regurgitation. No evidence of mitral stenosis.   6. The aortic valve is tricuspid. There is mild calcification of the  aortic valve. Aortic valve regurgitation is not visualized. No aortic  stenosis is present.   7. Aortic dilatation noted. There is borderline dilatation of the aortic  root, measuring 38 mm.   8. The inferior vena cava is normal in size with <50% respiratory  variability, suggesting right atrial pressure of 8 mmHg.     Labs:    Latest Ref Rng & Units 07/04/2024   10:27 AM 05/02/2024    8:16 AM 03/22/2021    4:35 PM  CBC  WBC 3.4 - 10.8 x10E3/uL 10.7  13.5  13.7   Hemoglobin 13.0 - 17.7 g/dL 84.8  86.6  83.9   Hematocrit 37.5 - 51.0 % 48.0  39.7  46.3   Platelets 150 - 450 x10E3/uL 280  293  395        Latest Ref Rng & Units 07/04/2024   10:27 AM 05/02/2024    8:16 AM 03/22/2021    4:35 PM  BMP  Glucose 70 - 99 mg/dL 724  805  611   BUN 6 - 24 mg/dL 29  24  17    Creatinine 0.76 - 1.27 mg/dL 8.80  9.10  9.36   BUN/Creat Ratio 9 - 20 24     Sodium 134 - 144 mmol/L 134  137  128   Potassium 3.5 - 5.2 mmol/L 4.9  4.2  3.8   Chloride 96 - 106 mmol/L 93  99  93   CO2 20 - 29 mmol/L 32  27  26   Calcium 8.7 - 10.2 mg/dL 89.9  9.7  9.3       Latest Ref Rng & Units 07/04/2024   10:27 AM 05/02/2024    8:16 AM 03/22/2021    4:35 PM  CMP  Glucose 70 - 99 mg/dL 724  805  611   BUN 6 - 24 mg/dL 29  24  17    Creatinine 0.76 - 1.27 mg/dL 8.80  9.10  9.36   Sodium 134 - 144 mmol/L 134  137  128    Potassium 3.5 - 5.2 mmol/L 4.9  4.2  3.8   Chloride 96 - 106 mmol/L 93  99  93   CO2 20 - 29 mmol/L 32  27  26   Calcium 8.7 - 10.2 mg/dL 89.9  9.7  9.3   Total Protein 6.5 - 8.1 g/dL   7.2   Total Bilirubin 0.3 - 1.2 mg/dL   0.5   Alkaline Phos 38 - 126 U/L   93   AST 15 - 41 U/L   16   ALT 0 - 44 U/L   18     No results found for: CHOL, HDL, LDLCALC, LDLDIRECT, TRIG, CHOLHDL No results for input(s): LIPOA in the last 8760 hours. No components found for: NTPROBNP No results for input(s): PROBNP in the last 8760 hours. Recent Labs    05/02/24 0816  TSH 1.990    Physical Exam:    Today's Vitals   08/02/24 0859  BP: (!) 158/94  Pulse: 73  SpO2: 97%  Weight: 280 lb (127 kg)  Height: 6' 1 (1.854 m)   Body mass index is 36.94 kg/m. Wt Readings from Last 3 Encounters:  08/02/24 280 lb (127 kg)  07/22/24 283 lb (128.4 kg)  07/19/24 283 lb (128.4 kg)    Physical Exam  Constitutional: No distress.  hemodynamically stable, ambulates w/ cane, currently in wheelchair  Neck: No JVD present.  Cardiovascular: Normal rate, regular rhythm, S1 normal and S2 normal. Exam reveals no gallop, no S3 and no S4.  No murmur heard. Pulmonary/Chest: Effort normal and breath sounds normal. No stridor. He has no wheezes. He has no rales.  Musculoskeletal:        General: No edema.     Cervical back: Neck supple.  Skin: Skin is warm.     Impression & Recommendation(s):  Impression:   ICD-10-CM   1. Persistent atrial fibrillation (HCC)  I48.19 EKG 12-Lead    ECHOCARDIOGRAM COMPLETE    2. Hypercoagulable  state due to persistent atrial fibrillation (HCC)  D68.69    I48.19     3. Benign hypertension  I10     4. Type 2 diabetes mellitus with hyperglycemia, with long-term current use of insulin (HCC)  E11.65    Z79.4     5. Mixed hyperlipidemia  E78.2     6. Sleep apnea in adult  G47.30        Recommendation(s):  Persistent atrial fibrillation [facility]) Initially  discovered July 2025 Rate control: Metoprolol . Rhythm control: Amiodarone . Thromboembolic prophylaxis: Eliquis  History of direct-current cardioversion 05/27/2024, 07/22/2024 Had establish care with the EP with plans to undergo atrial fibrillation ablation on 08/19/2024 EKG today illustrates sinus rhythm Endorses compliance with CPAP. Currently on Ozempic to help facilitate weight loss Patient is made aware that he will need to be monitored for amiodarone  side effects if used for long-term.  Hypercoagulable state due to persistent atrial fibrillation (HCC) Risks, benefits, and alternatives to oral anticoagulation discussed Recommend checking H&H and BMP every 6 months to monitor hemoglobin and renal function  Benign hypertension Office blood pressures are elevated. Currently on lisinopril/hydrochlorothiazide 20/25 mg p.o. daily. Currently on Lopressor  50 mg p.o. twice daily Recommend checking his blood pressures at home and if elevated would recommend reaching out to PCP for uptitration of antihypertensive medications. Reemphasized importance of low-salt diet. Emphasized importance of addressing sleep apnea  Type 2 diabetes mellitus with hyperglycemia, with long-term current use of insulin (HCC) Per patient, diabetic likely since 2010 Currently on ACE inhibitors, metformin, Ozempic, Crestor Reemphasize importance of glycemic control.  Sleep apnea in adult Patient endorses compliance with CPAP.  Patient's echocardiogram in the recent past noted mildly reduced LVEF.  Likely secondary to underlying atrial fibrillation.  Recommend repeating an echocardiogram once he maintains sinus rhythm.  Since he is going to have an ablation end of October 2025 we will repeat an echo in February 2026 prior to the next office visit to reevaluate LVEF.  If it still remains low we will discuss ischemic workup.  Patient agreeable with the plan of care.    Orders Placed:  Orders Placed This Encounter   Procedures   EKG 12-Lead   ECHOCARDIOGRAM COMPLETE    Standing Status:   Future    Expiration Date:   08/02/2025    Where should this test be performed[facility]    Does the patient weigh less than or greater than 250 lbs?:   Patient weighs greater than 250 lbs    Perflutren  DEFINITY  (image enhancing agent) should be administered unless hypersensitivity or allergy exist:   Administer Perflutren     Reason for exam-Echo:   Other-Full Diagnosis List    Full ICD-10/Reason for Exam:   Atrial fibrillation (HCC) [427.31.ICD-9-CM]     Final Medication List:   No orders of the defined types were placed in this encounter.   There are no discontinued medications.   Current Outpatient Medications:    amiodarone  (PACERONE ) 200 MG tablet, Take 1 tablet (200 mg total) by mouth 2 (two) times daily for 30 days, THEN 1 tablet (200 mg total) daily. (Patient taking differently: Take 1 tablet (200 mg total) daily.), Disp: 60 tablet, Rfl: 3   apixaban  (ELIQUIS ) 5 MG TABS tablet, Take 1 tablet (5 mg total) by mouth 2 (two) times daily., Disp: 60 tablet, Rfl: 3   buPROPion (WELLBUTRIN XL) 300 MG 24 hr tablet, Take 300 mg by mouth daily., Disp: , Rfl:    diclofenac (VOLTAREN) 75  MG EC tablet, Take 75 mg by mouth 2 (two) times daily., Disp: , Rfl:    DULoxetine (CYMBALTA) 30 MG capsule, Take 30 mg by mouth daily., Disp: , Rfl:    DULoxetine (CYMBALTA) 60 MG capsule, Take 60 mg by mouth daily., Disp: , Rfl:    lisinopril-hydrochlorothiazide (ZESTORETIC) 20-25 MG tablet, Take 1 tablet by mouth daily., Disp: , Rfl:    Magnesium  400 MG CAPS, Take 400 mg by mouth daily., Disp: 30 capsule, Rfl: 2   metFORMIN (GLUCOPHAGE) 1000 MG tablet, Take 1,000 mg by mouth 2 (two) times daily with a meal., Disp: , Rfl:    metoprolol  tartrate (LOPRESSOR ) 50 MG tablet, Take 1 tablet (50 mg total) by mouth 2 (two) times daily. Hold if systolic blood pressure (top number) less than 100 mmHg or pulse less than 55 bpm, Disp:  60 tablet, Rfl: 6   omeprazole (PRILOSEC OTC) 20 MG tablet, Take 40 mg by mouth daily., Disp: , Rfl:    OZEMPIC, 1 MG/DOSE, 4 MG/3ML SOPN, Inject 1 mg into the skin once a week., Disp: , Rfl:    pregabalin (LYRICA) 150 MG capsule, Take 150 mg by mouth 2 (two) times daily., Disp: , Rfl:    rosuvastatin (CRESTOR) 20 MG tablet, Take 20 mg by mouth daily., Disp: , Rfl:    TRESIBA FLEXTOUCH 200 UNIT/ML FlexTouch Pen, Inject 100 Units into the skin every morning., Disp: , Rfl:   Consent:   NA  Disposition:   Feb 2026, after the echo  Patient may be asked to follow-up sooner based on the results of the above-mentioned testing.  His questions and concerns were addressed to his satisfaction. He voices understanding of the recommendations provided during this encounter.    Signed, [name], [facility]  A Division of [facility] [address], [place], [place] [address]  08/02/2024 10:32 AM

## 2024-08-06 LAB — BASIC METABOLIC PANEL WITH GFR

## 2024-08-07 ENCOUNTER — Emergency Department (HOSPITAL_BASED_OUTPATIENT_CLINIC_OR_DEPARTMENT_OTHER)

## 2024-08-07 ENCOUNTER — Emergency Department (HOSPITAL_BASED_OUTPATIENT_CLINIC_OR_DEPARTMENT_OTHER): Admission: EM | Admit: 2024-08-07 | Discharge: 2024-08-07 | Disposition: A | Discharge status: Home / Self Care

## 2024-08-07 ENCOUNTER — Other Ambulatory Visit: Payer: Self-pay

## 2024-08-07 ENCOUNTER — Encounter (HOSPITAL_BASED_OUTPATIENT_CLINIC_OR_DEPARTMENT_OTHER): Payer: Self-pay | Admitting: Emergency Medicine

## 2024-08-07 DIAGNOSIS — Z7984 Long term (current) use of oral hypoglycemic drugs: Secondary | ICD-10-CM | POA: Insufficient documentation

## 2024-08-07 DIAGNOSIS — M25562 Pain in left knee: Secondary | ICD-10-CM | POA: Diagnosis present

## 2024-08-07 DIAGNOSIS — E1142 Type 2 diabetes mellitus with diabetic polyneuropathy: Secondary | ICD-10-CM | POA: Insufficient documentation

## 2024-08-07 DIAGNOSIS — R238 Other skin changes: Secondary | ICD-10-CM | POA: Insufficient documentation

## 2024-08-07 DIAGNOSIS — Z7901 Long term (current) use of anticoagulants: Secondary | ICD-10-CM | POA: Diagnosis not present

## 2024-08-07 DIAGNOSIS — I4891 Unspecified atrial fibrillation: Secondary | ICD-10-CM | POA: Insufficient documentation

## 2024-08-07 DIAGNOSIS — Z87891 Personal history of nicotine dependence: Secondary | ICD-10-CM | POA: Diagnosis not present

## 2024-08-07 DIAGNOSIS — W108XXA Fall (on) (from) other stairs and steps, initial encounter: Secondary | ICD-10-CM | POA: Insufficient documentation

## 2024-08-07 DIAGNOSIS — L819 Disorder of pigmentation, unspecified: Secondary | ICD-10-CM

## 2024-08-07 LAB — BASIC METABOLIC PANEL WITH GFR
Anion gap: 14 (ref 5–15)
BUN: 25 mg/dL — ABNORMAL HIGH (ref 6–20)
CO2: 27 mmol/L (ref 22–32)
Calcium: 9.8 mg/dL (ref 8.9–10.3)
Chloride: 97 mmol/L — ABNORMAL LOW (ref 98–111)
Creatinine, Ser: 0.87 mg/dL (ref 0.61–1.24)
GFR, Estimated: >60 mL/min (ref >60)
Glucose, Bld: 279 mg/dL — ABNORMAL HIGH (ref 70–99)
Potassium: 4.3 mmol/L (ref 3.5–5.1)
Sodium: 137 mmol/L (ref 135–145)

## 2024-08-07 LAB — CBC
HCT: 45.6 % (ref 39.0–52.0)
Hematocrit: 49.9 % (ref 37.5–51.0)
Hemoglobin: 15.3 g/dL (ref 13.0–17.7)
Hemoglobin: 15.3 g/dL (ref 13.0–17.0)
MCH: 26.7 pg (ref 26.6–33.0)
MCH: 27.3 pg (ref 26.0–34.0)
MCHC: 30.7 g/dL — ABNORMAL LOW (ref 31.5–35.7)
MCHC: 33.6 g/dL (ref 30.0–36.0)
MCV: 87 fL (ref 79–97)
MCV: 81.4 fL (ref 80.0–100.0)
Platelets: 287 x10E3/uL (ref 150–450)
Platelets: 279 K/uL (ref 150–400)
RBC: 5.73 x10E6/uL (ref 4.14–5.80)
RBC: 5.6 MIL/uL (ref 4.22–5.81)
RDW: 13 % (ref 11.6–15.4)
RDW: 12.9 % (ref 11.5–15.5)
WBC: 11.3 x10E3/uL — ABNORMAL HIGH (ref 3.4–10.8)
WBC: 12.9 K/uL — ABNORMAL HIGH (ref 4.0–10.5)
nRBC: 0 % (ref 0.0–0.2)

## 2024-08-07 MED ORDER — LIDOCAINE 5 % EX PTCH
1.0000 | MEDICATED_PATCH | CUTANEOUS | Status: DC
  Administered 2024-08-07: 1 via TRANSDERMAL
  Filled 2024-08-07: qty 1

## 2024-08-07 MED ORDER — ACETAMINOPHEN 500 MG PO TABS
1000.0000 mg | ORAL_TABLET | Freq: Once | ORAL | Status: AC
  Administered 2024-08-07: 1000 mg via ORAL
  Filled 2024-08-07: qty 2

## 2024-08-07 MED ORDER — IOHEXOL 350 MG/ML SOLN
125.0000 mL | Freq: Once | INTRAVENOUS | Status: AC | PRN
  Administered 2024-08-07: 125 mL via INTRAVENOUS

## 2024-08-07 NOTE — ED Provider Notes (Signed)
 [facility] AT [facility] Provider Note   CSN: [id number] Arrival date & time: 08/07/24  1014     Patient presents with: Knee Injury   Larry Hammond is a 53 y.o. male.   53 year old male presenting after a fall.  Patient reports that last night he was walking up to his front steps when he fell forward, with most of the impact occurring to his left knee.  He has not taken any medications for relief of his symptoms.  History of A-fib, on Eliquis , scheduled for an ablation later this month.  History of diabetes, on Ozempic, metformin, Tresiba, last A1c earlier this year was 70.  Patient has not previously been seen by a vascular specialist, he reports frequent falls which he attributes to his peripheral neuropathy secondary to type 2 diabetes and degenerative disc disease.       Prior to Admission medications   Medication Sig Start Date End Date Taking? Authorizing Provider  amiodarone  (PACERONE ) 200 MG tablet Take 1 tablet (200 mg total) by mouth 2 (two) times daily for 30 days, THEN 1 tablet (200 mg total) daily. Patient taking differently: Take 1 tablet (200 mg total) daily. 06/20/24 07/20/25  Terra Fairy PARAS, PA-C  apixaban  (ELIQUIS ) 5 MG TABS tablet Take 1 tablet (5 mg total) by mouth 2 (two) times daily. 05/09/24   Terra Fairy PARAS, PA-C  buPROPion (WELLBUTRIN XL) 300 MG 24 hr tablet Take 300 mg by mouth daily.    [provider]  diclofenac (VOLTAREN) 75 MG EC tablet Take 75 mg by mouth 2 (two) times daily.    [provider]  DULoxetine (CYMBALTA) 30 MG capsule Take 30 mg by mouth daily.    [provider]  DULoxetine (CYMBALTA) 60 MG capsule Take 60 mg by mouth daily. 04/30/24   [provider]  lisinopril-hydrochlorothiazide (ZESTORETIC) 20-25 MG tablet Take 1 tablet by mouth daily.    [provider]  Magnesium  400 MG CAPS Take 400 mg by mouth daily. 05/09/24   Terra Fairy PARAS, PA-C  metFORMIN (GLUCOPHAGE)  1000 MG tablet Take 1,000 mg by mouth 2 (two) times daily with a meal.    [provider]  metoprolol  tartrate (LOPRESSOR ) 50 MG tablet Take 1 tablet (50 mg total) by mouth 2 (two) times daily. Hold if systolic blood pressure (top number) less than 100 mmHg or pulse less than 55 bpm 06/27/24   Terra Fairy PARAS, PA-C  omeprazole (PRILOSEC OTC) 20 MG tablet Take 40 mg by mouth daily.    [provider]  OZEMPIC, 1 MG/DOSE, 4 MG/3ML SOPN Inject 1 mg into the skin once a week. 03/27/24   [provider]  pregabalin (LYRICA) 150 MG capsule Take 150 mg by mouth 2 (two) times daily.    [provider]  rosuvastatin (CRESTOR) 20 MG tablet Take 20 mg by mouth daily.    [provider]  TRESIBA FLEXTOUCH 200 UNIT/ML FlexTouch Pen Inject 100 Units into the skin every morning.    [provider]    Allergies: Patient has no known allergies.    Review of Systems  Updated Vital Signs  Vitals:   08/07/24 1035 08/07/24 1038  BP:  (!) 142/82  Pulse:  74  Resp:  16  Temp:  98.3 F (36.8 C)  TempSrc:  Oral  SpO2:  99%  Weight: 127 kg      Physical Exam Vitals and nursing note reviewed.  HENT:     Head:  Normocephalic.  Eyes:     Pupils: Pupils are equal, round, and reactive to light.  Cardiovascular:     Rate and Rhythm: Normal rate.     Comments: Unable to palpate DP and PT pulses bilaterally Unable to detect DP/PT pulses with doppler Pulmonary:     Effort: Pulmonary effort is normal.  Musculoskeletal:     Cervical back: Normal range of motion.     Comments: L knee: Flexion limited secondary to pain, full active/passive extension. No appreciable bony deformity/dislocation Ankles: Unable to dorsiflex bilaterally, in-tact inversion/eversion, in-tact plantarflexion however strength is diminished L>R. Lower extremities with pronounced sensory deficits bilaterally  Skin:    General: Skin is warm and dry.     Comments: Significant discoloration  to bilateral LE's, L>R, with associated muscle atrophy and wounds, see photo Unable to appreciate capillary refill with significant pallor noted to nail beds  Neurological:     Mental Status: He is alert and oriented to person, place, and time.     (all labs ordered are listed, but only abnormal results are displayed) Labs Reviewed  CBC - Abnormal; Notable for the following components:      Result Value   WBC 12.9 (*)    All other components within normal limits  BASIC METABOLIC PANEL WITH GFR - Abnormal; Notable for the following components:   Chloride 97 (*)    Glucose, Bld 279 (*)    BUN 25 (*)    All other components within normal limits    EKG: None  Radiology: CT Angio Aortobifemoral W and/or Wo Contrast Result Date: 08/07/2024 CLINICAL DATA:  53 year old male, pulseless bilateral lower extremities. EXAM: CT ANGIOGRAPHY OF ABDOMINAL AORTA WITH ILIOFEMORAL RUNOFF TECHNIQUE: Multidetector CT imaging of the abdomen, pelvis and lower extremities was performed using the standard protocol during bolus administration of intravenous contrast. Multiplanar CT image reconstructions and MIPs were obtained to evaluate the vascular anatomy. RADIATION DOSE REDUCTION: This exam was performed according to the departmental dose-optimization program which includes automated exposure control, adjustment of the mA and/or kV according to patient size and/or use of iterative reconstruction technique. CONTRAST:  125mL OMNIPAQUE IOHEXOL 350 MG/ML SOLN COMPARISON:  None Available. FINDINGS: VASCULAR Aorta: Normal caliber aorta without aneurysm, dissection, vasculitis or significant stenosis. Celiac: Patent without evidence of aneurysm, dissection, vasculitis or significant stenosis. SMA: Patent without evidence of aneurysm, dissection, vasculitis or significant stenosis. Renals: Both renal arteries are patent without evidence of aneurysm, dissection, vasculitis, fibromuscular dysplasia or significant stenosis.  Moderate short segment gradual stenosis of the proximal single left renal artery without definite associated atherosclerotic plaque formation. IMA: Patent without evidence of aneurysm, dissection, vasculitis or significant stenosis. RIGHT Lower Extremity Inflow: Common, internal and external iliac arteries are patent without evidence of aneurysm, dissection, vasculitis or significant stenosis. Scattered atherosclerotic calcifications Outflow: The common and profundus femoral arteries are patent. The superficial femoral artery is patent with a focal mild stenosis about Hunter's canal. The popliteal artery is patent. Runoff: Chronic appearing occlusion of the anterior tibial artery with a few scattered atherosclerotic calcifications. There is 2 vessel runoff to level of the ankle via the peroneal posterior tibial arteries. LEFT Lower Extremity Inflow: Common, internal and external iliac arteries are patent without evidence of aneurysm, dissection, vasculitis or significant stenosis. Scattered atherosclerotic calcifications. Outflow: The common and profundus femoral arteries are patent. The superficial femoral artery is patent with a focal mild stenosis about Hunter's canal. The popliteal artery is patent Runoff: Chronic appearing occlusion of the anterior tibial artery  with a few scattered atherosclerotic calcifications. There is 2 vessel runoff to level of the ankle via the peroneal posterior tibial arteries Veins: No obvious venous abnormality within the limitations of this arterial phase study. Review of the MIP images confirms the above findings. NON-VASCULAR Lower chest: No acute abnormality. Hepatobiliary: No focal liver abnormality is seen. No gallstones, gallbladder wall thickening, or biliary dilatation. Pancreas: Unremarkable. No pancreatic ductal dilatation or surrounding inflammatory changes. Spleen: Normal in size without focal abnormality. Adrenals/Urinary Tract: Adrenal glands are unremarkable. Kidneys  are normal, without renal calculi, focal lesion, or hydronephrosis. Bladder is unremarkable. Stomach/Bowel: Stomach is within normal limits. Appendix appears normal. No evidence of bowel wall thickening, distention, or inflammatory changes. Lymphatic: No abdominopelvic lymphadenopathy. Reproductive: Prostate is unremarkable. Other: Unchanged moderate fat containing umbilical hernia. No abdominopelvic ascites. Musculoskeletal: No acute or significant osseous findings. IMPRESSION: VASCULAR 1. No evidence of acute vascular abnormality. 2. Patent bilateral inflow, outflow, and 2 vessel runoff arteries to the level of the ankle. Chronically occluded bilateral anterior tibial arteries. 3. Mild atherosclerotic changes about the bilateral common iliac and superficial femoral arteries without flow-limiting stenosis. NON-VASCULAR 1. No acute abdominopelvic abnormality. 2. Unchanged moderate fat containing umbilical hernia without complicating features. Ester Sides, MD [facility] Radiology Electronically Signed   By: Ester Sides M.D.   On: 08/07/2024 14:07   DG Knee Complete 4 Views Left Result Date: 08/07/2024 CLINICAL DATA:  Left knee injury EXAM: LEFT KNEE - COMPLETE 4+ VIEW COMPARISON:  None Available. FINDINGS: Moderate medial compartment predominant tricompartmental joint space narrowing, osteophytosis and subchondral sclerosis. No acute fractures identified. Likely small left knee joint effusion. IMPRESSION: No acute osseous findings.  Moderate left knee joint osteoarthrosis. Electronically Signed   By: Michaeline Blanch M.D.   On: 08/07/2024 11:53     Procedures   Medications Ordered in the ED  acetaminophen  (TYLENOL ) tablet 1,000 mg (has no administration in time range)  lidocaine  (LIDODERM ) 5 % 1 patch (has no administration in time range)  iohexol (OMNIPAQUE) 350 MG/ML injection 125 mL (125 mLs Intravenous Contrast Given 08/07/24 1320)                                     Medical Decision Making This patient presents to the ED for concern of knee pain, this involves an extensive number of treatment options, and is a complaint that carries with it a high risk of complications and morbidity.  The differential diagnosis includes fracture, dislocation, contusion, musculoskeletal pain/strain/sprain, PAD, acute limb ischemia   Co morbidities that complicate the patient evaluation  Afib on eliquis , poorly controlled T2DM   Additional history obtained:  Additional history obtained from record review External records from outside source obtained and reviewed including recent cardiology note   Lab Tests:  I Ordered, and personally interpreted labs.  The pertinent results include: CBC notable for leukocytosis of 12.9, 5 days ago patient had leukocytosis of 11.3.  BMP notable for hyperglycemia of 279.   Imaging Studies ordered:  I ordered imaging studies including L knee XR, CT runoff study I independently visualized and interpreted imaging which showed No acute osseous findings. - XR:  Moderate left knee joint osteoarthrosis. - CT runoff: IMPRESSION: VASCULAR  1. No evidence of acute vascular abnormality. 2. Patent bilateral inflow, outflow, and 2 vessel runoff arteries to the level of the ankle. Chronically occluded bilateral anterior tibial arteries. 3. Mild atherosclerotic  changes about the bilateral common iliac and superficial femoral arteries without flow-limiting stenosis.  NON-VASCULAR  1. No acute abdominopelvic abnormality. 2. Unchanged moderate fat containing umbilical hernia without complicating features.  I agree with the radiologist interpretation   Cardiac Monitoring: / EKG:  The patient was maintained on a cardiac monitor.  I personally viewed and interpreted the cardiac monitored which showed an underlying rhythm of: NSR   Consultations Obtained:  I requested consultation with the vascular surgeon,  and discussed lab  and imaging findings as well as pertinent plan - they recommend: I spoke with the vascular surgeon, Dr. Gretta, who has a low suspicion that this patient is suffering from an acute vascular accident given the fact that he was presenting to the ED with a separate complaint. I informed Dr. Gretta that we are unable to measure ABI in this facility, and I requested his recommendation for further work-up given the fact that myself and my attending physician were unable to palpate distal pulses on this patient manually or with use of a doppler. He recommends CT runoff study. I called Dr. Gretta back after CT runoff study was completed, he independently reviewed this imaging and does not feel that there are any acute findings that would necessitate intervention at this time, he recommends that patient follow-up out patient for further evaluation/ABI's.    Problem List / ED Course / Critical interventions / Medication management  I ordered medication including Tylenol  and lidocaine  patch for knee pain I have reviewed the patients home medicines and have made adjustments as needed   Social Determinants of Health:  Former tobacco use   Test / Admission - Considered:  Physical exam notable as above, patient does have lower extremities that are somewhat cool to the touch with associated skin discoloration and wounds, L>R, as well as muscle atrophy, patient describes symptoms that seem consistent with claudication including burning pain in LE's with ambulation, poor strength to bilateral LE's with significant paresthesias bilaterally, history of A-fib and poorly controlled diabetes.  Unable to palpate DP/PT pulses manually or with ultrasound, attempted by myself and my attending Dr. Simon.  I called and spoke with the vascular specialist, Dr. Gretta, as above.  Results of CT runoff study indicate Chronically occluded bilateral anterior tibial arteries, but no acute vascular changes.  At time of reassessment,  patient's legs have remained elevated, and the discoloration has significantly improved with elevation.  I suspect that this patient likely suffers from peripheral arterial disease, will refer to vascular specialist for outpatient follow-up.  I recommend that he continue Tylenol  for knee pain, unfortunately he is limited on the medications he can take as he is on Eliquis  for A-fib.  He voiced understanding is in agreement this plan, return precautions discussed.  He is appropriate for discharge at this time.     Amount and/or Complexity of Data Reviewed Labs: ordered. Radiology: ordered.  Risk OTC drugs. Prescription drug management.        Final diagnoses:  Acute pain of left knee  Discoloration of skin of multiple sites of lower extremity    ED Discharge Orders     None          Glendia Rocky SAILOR, [place] 08/07/24 1522    Simon Lavonia SAILOR, MD 08/07/24 1525

## 2024-08-07 NOTE — ED Notes (Signed)
 Left knee ace wrapped per request pt transported out to waiting room in w/c

## 2024-08-07 NOTE — Discharge Instructions (Addendum)
 Continue Tylenol  for management of your knee pain, discuss your symptoms with your PCP if your knee pain persists.  Your lower extremity discoloration, poor pulses, chronic pain/neuropathy, and wounds are most consistent with a diagnosis of peripheral arterial disease. Your CT scan shows chronic occlusion of you anterior tibial arteries bilaterally, as well as atherosclerotic changes in other arteries of your legs - this leads to poor/diminished blood flow to your legs.  I have provided you with the contact information for a vascular specialist, please contact their office to schedule follow-up in regard to this.  Return to the emergency department if your symptoms worsen.

## 2024-08-07 NOTE — ED Triage Notes (Signed)
 Tripped  at home 2 nights ago , left knee injury , swelling and bruising .

## 2024-08-13 ENCOUNTER — Other Ambulatory Visit: Payer: Self-pay

## 2024-08-13 DIAGNOSIS — I739 Peripheral vascular disease, unspecified: Secondary | ICD-10-CM

## 2024-08-15 ENCOUNTER — Encounter (HOSPITAL_COMMUNITY): Payer: Self-pay

## 2024-08-15 NOTE — Telephone Encounter (Signed)
 Larry Hammond LABOR, MD  Lanis Charleston KIDD, RN Thanks a ton,  Looked at his ED visit and should be fine to proceed.  Have a great day!  Matt       Previous Messages    ----- Message ----- From: Lanis Charleston KIDD, RN Sent: 08/15/2024  11:19 AM EDT To: Hammond LABOR Almetta, MD Subject: Please advise- proc 10/24                      I was reviewing next weeks cases. This pt is sched for an AF ablation on 10/24. Can you review his ED note/pics from 10/8. I don't think it will interfere with anything, but just wanted to confirm.

## 2024-08-15 NOTE — Telephone Encounter (Signed)
 Attempted to reach patient to discuss upcoming procedure, no answer. Left VM for patient to return call.

## 2024-08-21 NOTE — Telephone Encounter (Signed)
 Patient returned call to complete pre-procedure call.     Health status review:  Any new medical conditions, recent signs of acute illness or been started on antibiotics? No. Dr Almetta aware of last ED visit. Any new medications started since pre-op visit? No  Follow all medication instructions prior to procedure or the procedure may be rescheduled:    Continue taking Eliquis  (Apixaban ) twice daily without missing any doses before procedure.  HOLD: Semaglutide (Ozempic, Rybelsus, Wegovy) for 1 week prior to the procedure. Last dose on or beforeThursday, October 16.  Adjust Tresiba (Insulin): The day before your procedure, you may take your usual morning dose. The morning of your procedure you will only take  of your usual dose. Essential chronic medications:  No medication should be continued, unless told otherwise. On the morning of your procedure DO NOT take any medication., including Eliquis  (Apixaban ).  Nothing to eat or drink after midnight prior to your procedure.  Pre-procedure testing scheduled: CT not needed. Lab work completed.  Confirmed patient is scheduled for Atrial Fibrillation Ablation on Friday, October 24 with Dr. Dr. Almetta. Instructed patient to arrive at the Main Entrance A at [facility]: [address] [place], [place] [address] and check in at Admitting at 8:00 AM.  Plan to go home the same day, you will only stay overnight if medically necessary.. You MUST have a responsible adult to drive you home and MUST be with you the first 24 hours after you arrive home or your procedure could be cancelled.  Informed patient a nurse will call a day before the procedure to confirm arrival time and ensure instructions are followed.  Patient verbalized understanding to information provided and is agreeable to proceed with procedure.   Advised patient to contact RN Navigator at [phone], to inform of any new medications started after call or concerns prior to  procedure.

## 2024-08-22 NOTE — Pre-Procedure Instructions (Addendum)
 Attempted to call patient regarding procedure instructions for tomorrow.  Left voicemail on the following items: Arrival time 0800 Nothing to eat or drink after midnight No meds AM of procedure Responsible person to drive you home and stay with you for 24 hrs  Have you missed any doses of anti-coagulant Eliquis - should be taken twice a day,  if you have missed any doses please let us  know.  Don't take dose morning of procedure.  Ozempic- should have been holding last 4 days.

## 2024-08-23 ENCOUNTER — Encounter (HOSPITAL_COMMUNITY)
Admission: RE | Disposition: A | Payer: Self-pay | Source: Home / Self Care | Attending: Student in an Organized Health Care Education/Training Program

## 2024-08-23 ENCOUNTER — Other Ambulatory Visit: Payer: Self-pay

## 2024-08-23 ENCOUNTER — Ambulatory Visit (HOSPITAL_COMMUNITY)
Admission: RE | Admit: 2024-08-23 | Discharge: 2024-08-23 | Disposition: A | Discharge status: Home / Self Care | Attending: Student in an Organized Health Care Education/Training Program | Admitting: Student in an Organized Health Care Education/Training Program

## 2024-08-23 ENCOUNTER — Ambulatory Visit (HOSPITAL_COMMUNITY): Admitting: Anesthesiology

## 2024-08-23 ENCOUNTER — Encounter (HOSPITAL_COMMUNITY): Admitting: Anesthesiology

## 2024-08-23 DIAGNOSIS — E1136 Type 2 diabetes mellitus with diabetic cataract: Secondary | ICD-10-CM | POA: Insufficient documentation

## 2024-08-23 DIAGNOSIS — I10 Essential (primary) hypertension: Secondary | ICD-10-CM | POA: Insufficient documentation

## 2024-08-23 DIAGNOSIS — Z7901 Long term (current) use of anticoagulants: Secondary | ICD-10-CM | POA: Insufficient documentation

## 2024-08-23 DIAGNOSIS — I4819 Other persistent atrial fibrillation: Secondary | ICD-10-CM | POA: Diagnosis not present

## 2024-08-23 LAB — POCT ACTIVATED CLOTTING TIME
Activated Clotting Time: 262 s
Activated Clotting Time: 285 s
Activated Clotting Time: 302 s
Activated Clotting Time: 342 s
Activated Clotting Time: 343 s
Activated Clotting Time: 337 s

## 2024-08-23 LAB — GLUCOSE, CAPILLARY
Glucose-Capillary: 292 mg/dL — ABNORMAL HIGH (ref 70–99)
Glucose-Capillary: 237 mg/dL — ABNORMAL HIGH (ref 70–99)

## 2024-08-23 SURGERY — ATRIAL FIBRILLATION ABLATION
Anesthesia: General

## 2024-08-23 MED ORDER — SODIUM CHLORIDE 0.9 % IV SOLN: INTRAVENOUS | Status: DC

## 2024-08-23 MED ORDER — HEPARIN SODIUM (PORCINE) 1000 UNIT/ML IJ SOLN
INTRAMUSCULAR | Status: AC
  Filled 2024-08-23: qty 20

## 2024-08-23 MED ORDER — DEXAMETHASONE SOD PHOSPHATE PF 10 MG/ML IJ SOLN
INTRAMUSCULAR | Status: DC | PRN
  Administered 2024-08-23: 5 mg via INTRAVENOUS

## 2024-08-23 MED ORDER — HEPARIN SODIUM (PORCINE) 1000 UNIT/ML IJ SOLN
INTRAMUSCULAR | Status: AC
  Filled 2024-08-23: qty 1

## 2024-08-23 MED ORDER — HEPARIN SODIUM (PORCINE) 1000 UNIT/ML IJ SOLN
INTRAMUSCULAR | Status: DC | PRN
Start: 2024-08-23 — End: 2024-08-23
  Administered 2024-08-23: 8000 [IU] via INTRAVENOUS
  Administered 2024-08-23: 19000 [IU] via INTRAVENOUS
  Administered 2024-08-23: 4000 [IU] via INTRAVENOUS
  Administered 2024-08-23: 7000 [IU] via INTRAVENOUS
  Administered 2024-08-23: 3000 [IU] via INTRAVENOUS

## 2024-08-23 MED ORDER — PROPOFOL 10 MG/ML IV BOLUS
INTRAVENOUS | Status: DC | PRN
  Administered 2024-08-23: 160 mg via INTRAVENOUS

## 2024-08-23 MED ORDER — SODIUM CHLORIDE 0.9% FLUSH: 3.0000 mL | Freq: Two times a day (BID) | INTRAVENOUS | Status: DC

## 2024-08-23 MED ORDER — SODIUM CHLORIDE 0.9 % IV SOLN: 250.0000 mL | INTRAVENOUS | Status: DC | PRN

## 2024-08-23 MED ORDER — ACETAMINOPHEN 325 MG PO TABS: 650.0000 mg | ORAL_TABLET | ORAL | Status: DC | PRN

## 2024-08-23 MED ORDER — APIXABAN 5 MG PO TABS
5.0000 mg | ORAL_TABLET | Freq: Two times a day (BID) | ORAL | Status: DC
  Administered 2024-08-23: 5 mg via ORAL
  Filled 2024-08-23: qty 1

## 2024-08-23 MED ORDER — ROCURONIUM BROMIDE 10 MG/ML (PF) SYRINGE
PREFILLED_SYRINGE | INTRAVENOUS | Status: DC | PRN
  Administered 2024-08-23: 80 mg via INTRAVENOUS

## 2024-08-23 MED ORDER — ONDANSETRON HCL 4 MG/2ML IJ SOLN: 4.0000 mg | Freq: Four times a day (QID) | INTRAMUSCULAR | Status: DC | PRN

## 2024-08-23 MED ORDER — SODIUM CHLORIDE 0.9% FLUSH: 3.0000 mL | INTRAVENOUS | Status: DC | PRN

## 2024-08-23 MED ORDER — LACTATED RINGERS IV SOLN: INTRAVENOUS | Status: DC | PRN

## 2024-08-23 MED ORDER — ONDANSETRON HCL 4 MG/2ML IJ SOLN
INTRAMUSCULAR | Status: DC | PRN
  Administered 2024-08-23: 4 mg via INTRAVENOUS

## 2024-08-23 MED ORDER — HEPARIN SODIUM (PORCINE) 1000 UNIT/ML IJ SOLN
INTRAMUSCULAR | Status: DC | PRN
  Administered 2024-08-23: 1000 [IU] via INTRAVENOUS

## 2024-08-23 MED ORDER — HEPARIN (PORCINE) IN NACL 1000-0.9 UT/500ML-% IV SOLN
INTRAVENOUS | Status: DC | PRN
  Administered 2024-08-23 (×2): 500 mL

## 2024-08-23 MED ORDER — PHENYLEPHRINE HCL-NACL 20-0.9 MG/250ML-% IV SOLN
INTRAVENOUS | Status: DC | PRN
  Administered 2024-08-23: 50 ug/min via INTRAVENOUS

## 2024-08-23 MED ORDER — FENTANYL CITRATE (PF) 250 MCG/5ML IJ SOLN
INTRAMUSCULAR | Status: DC | PRN
  Administered 2024-08-23 (×2): 50 ug via INTRAVENOUS

## 2024-08-23 MED ORDER — LIDOCAINE 2% (20 MG/ML) 5 ML SYRINGE
INTRAMUSCULAR | Status: DC | PRN
  Administered 2024-08-23: 60 mg via INTRAVENOUS

## 2024-08-23 MED ORDER — PROTAMINE SULFATE 10 MG/ML IV SOLN
INTRAVENOUS | Status: DC | PRN
  Administered 2024-08-23: 40 mg via INTRAVENOUS

## 2024-08-23 MED ORDER — FENTANYL CITRATE (PF) 100 MCG/2ML IJ SOLN
INTRAMUSCULAR | Status: AC
  Filled 2024-08-23: qty 2

## 2024-08-23 MED ORDER — SUGAMMADEX SODIUM 200 MG/2ML IV SOLN
INTRAVENOUS | Status: DC | PRN
  Administered 2024-08-23: 200 mg via INTRAVENOUS

## 2024-08-23 MED ORDER — PHENYLEPHRINE 80 MCG/ML (10ML) SYRINGE FOR IV PUSH (FOR BLOOD PRESSURE SUPPORT)
PREFILLED_SYRINGE | INTRAVENOUS | Status: DC | PRN
  Administered 2024-08-23: 160 ug via INTRAVENOUS

## 2024-08-23 SURGICAL SUPPLY — 16 items
BAG SNAP BAND KOVER 36X36 (MISCELLANEOUS) IMPLANT
CATH GE 8FR SOUNDSTAR (CATHETERS) IMPLANT
CATH OCTARAY 2.0 F 3-3-3-3-3 (CATHETERS) IMPLANT
CATH WEBSTER BI DIR CS D-F CRV (CATHETERS) IMPLANT
CATHETER VARIPULSE 8.5FR (CATHETERS) IMPLANT
CLOSURE PERCLOSE PROSTYLE (Vascular Products) IMPLANT
COVER SWIFTLINK CONNECTOR (BAG) ×1 IMPLANT
KIT VERSACROSS 8.5F 63 45D 180 (KITS) IMPLANT
PACK EP LF (CUSTOM PROCEDURE TRAY) ×1 IMPLANT
PAD DEFIB RADIO PHYSIO CONN (PAD) ×1 IMPLANT
PATCH CARTO3 (PAD) IMPLANT
SHEATH CARTO VIZIGO SM CVD (SHEATH) IMPLANT
SHEATH PINNACLE 8F 10CM (SHEATH) IMPLANT
SHEATH PINNACLE 9F 10CM (SHEATH) IMPLANT
SHEATH PROBE COVER 6X72 (BAG) IMPLANT
TUBING SMART ABLATE COOLFLOW (TUBING) IMPLANT

## 2024-08-23 NOTE — Anesthesia Postprocedure Evaluation (Signed)
 Anesthesia Post Note  Patient: Larry Hammond  Procedure(s) Performed: ATRIAL FIBRILLATION ABLATION     Patient location during evaluation: PACU Anesthesia Type: General Level of consciousness: awake and alert and oriented Pain management: pain level controlled Vital Signs Assessment: post-procedure vital signs reviewed and stable Respiratory status: spontaneous breathing, nonlabored ventilation and respiratory function stable Cardiovascular status: blood pressure returned to baseline and stable Postop Assessment: no apparent nausea or vomiting Anesthetic complications: no   There were no known notable events for this encounter.  Last Vitals:  Vitals:   08/23/24 1355 08/23/24 1400  BP: (!) 151/83 (!) 151/78  Pulse: 68 70  Resp: 13 18  Temp:  36.6 C  SpO2: 96% 93%    Last Pain:  Vitals:   08/23/24 1400  TempSrc: Oral  PainSc:    Pain Goal: Patients Stated Pain Goal: 4 (08/23/24 0841)                 Jarrin Staley A.

## 2024-08-23 NOTE — Progress Notes (Addendum)
 Discharge instructions reviewed with patient and wife at bedside. Denies questions concerns. PT tolerated PO intake. Ambulated in the hallway, was able to void without difficulty. Seen by MD incision site remains clean dry and intact. Eliquis  administered before discharge. No s/s of complications. PT escorted from the unit via wheel chair to personal vehicle.

## 2024-08-23 NOTE — Anesthesia Preprocedure Evaluation (Signed)
 Anesthesia Evaluation  Patient identified by MRN, date of birth, ID band Patient awake    Reviewed: Allergy & Precautions, NPO status , Patient's Chart, lab work & pertinent test results  History of Anesthesia Complications Negative for: history of anesthetic complications  Airway Mallampati: III  TM Distance: >3 FB Neck ROM: Full    Dental  (+) Missing, Chipped, Dental Advisory Given   Pulmonary neg shortness of breath, sleep apnea and Continuous Positive Airway Pressure Ventilation , neg COPD, neg recent URI   breath sounds clear to auscultation       Cardiovascular hypertension, Pt. on medications (-) angina (-) Past MI + dysrhythmias Atrial Fibrillation  Rhythm:Regular   1. Left ventricular ejection fraction, by estimation, is 45 to 50%. The  left ventricle has mildly decreased function. The left ventricle has no  regional wall motion abnormalities. There is mild concentric left  ventricular hypertrophy. Left ventricular  diastolic function could not be evaluated.   2. Right ventricular systolic function is mildly reduced. The right  ventricular size is normal.   3. Left atrial size was mild to moderately dilated.   4. Right atrial size was moderately dilated.   5. The mitral valve is normal in structure. Mild mitral valve  regurgitation. No evidence of mitral stenosis.   6. The aortic valve is tricuspid. There is mild calcification of the  aortic valve. Aortic valve regurgitation is not visualized. No aortic  stenosis is present.   7. Aortic dilatation noted. There is borderline dilatation of the aortic  root, measuring 38 mm.   8. The inferior vena cava is normal in size with <50% respiratory  variability, suggesting right atrial pressure of 8 mmHg.     Neuro/Psych negative neurological ROS     GI/Hepatic negative GI ROS, Neg liver ROS,,,  Endo/Other  diabetes, Type 2    Renal/GU negative Renal ROS      Musculoskeletal negative musculoskeletal ROS (+)    Abdominal   Peds  Hematology  (+) Blood dyscrasia Lab Results      Component                Value               Date                      WBC                      12.9 (H)            08/07/2024                HGB                      15.3                08/07/2024                HCT                      45.6                08/07/2024                MCV                      81.4  08/07/2024                PLT                      279                 08/07/2024            eliquis    Anesthesia Other Findings   Reproductive/Obstetrics                              Anesthesia Physical Anesthesia Plan  ASA: 2  Anesthesia Plan: General   Post-op Pain Management: Minimal or no pain anticipated   Induction: Intravenous  PONV Risk Score and Plan: 2 and Ondansetron and Midazolam  Airway Management Planned: Oral ETT  Additional Equipment: None  Intra-op Plan:   Post-operative Plan: Extubation in OR  Informed Consent: I have reviewed the patients History and Physical, chart, labs and discussed the procedure including the risks, benefits and alternatives for the proposed anesthesia with the patient or authorized representative who has indicated his/her understanding and acceptance.     Dental advisory given  Plan Discussed with: CRNA  Anesthesia Plan Comments:          Anesthesia Quick Evaluation

## 2024-08-23 NOTE — H&P (Signed)
 ID:  Larry Hammond, DOB [birth year], MRN [id number] PCP: Evangelina Tinnie Norris, PA-C  [facility] Providers Electrophysiologist: Adina Primus, MD    History of Present Illness Larry Hammond is a 53 y.o. male with persistent AF/RVR, HTN, DM2, OSA, and cataracts who presents for evaluation of AF management.  He was seen in the emergency department on 05/02/2024 for new onset AF/RVR after going to an outpatient cataract surgery appointment.  He was prescribed metoprolol  to tartrate 25 mg twice daily and started on Eliquis  5 mg twice daily.  He underwent successful cardioversion on 05/27/2024 and TTE on the following day with mildly reduced LVEF of 45-50%.  Unfortunately this only lasted for short time and he went back into AF.  He did notice a significant improvement in shortness of breath and dyspnea on exertion when in normal rhythm.  He was started on amiodarone  200 mg twice daily x 1 month followed by 200 mg daily thereafter on 06/20/2024.   Scheduled for de novo PVI with Fara pulse/cardio on 10/24/Friday. Symptomatic AF with shortness of breath/dyspnea on exertion.  Planning for cardioversion on Monday.  Uninterrupted anticoagulation and Amio.   ROS: positive for SOB, generalized malaise    Studies Reviewed   TTE  Result date: 05/28/24  1. Left ventricular ejection fraction, by estimation, is 45 to 50%. The  left ventricle has mildly decreased function. The left ventricle has no  regional wall motion abnormalities. There is mild concentric left  ventricular hypertrophy. Left ventricular  diastolic function could not be evaluated.   2. Right ventricular systolic function is mildly reduced. The right  ventricular size is normal.   3. Left atrial size was mild to moderately dilated.   4. Right atrial size was moderately dilated.   5. The mitral valve is normal in structure. Mild mitral valve  regurgitation. No evidence of mitral stenosis.   6. The aortic valve is tricuspid.  There is mild calcification of the  aortic valve. Aortic valve regurgitation is not visualized. No aortic  stenosis is present.   7. Aortic dilatation noted. There is borderline dilatation of the aortic  root, measuring 38 mm.   8. The inferior vena cava is normal in size with <50% respiratory  variability, suggesting right atrial pressure of 8 mmHg.    ECG  Result date: 07/19/24 AF/VR 84, QRS 94, QT/c 370/437   Risk Assessment/Calculations   CHA2DS2-VASc Score = 2  This indicates a 2.2% annual risk of stroke. The patient's score is based upon: CHF History: 0 HTN History: 1 Diabetes History: 1 Stroke History: 0 Vascular Disease History: 0 Age Score: 0 Gender Score: 0       Physical Exam BP (!) 152/84   Pulse 67   Temp 98 F (36.7 C) (Oral)   Resp 16   Ht 6' 1 (1.854 m)   Wt 128.4 kg   SpO2 94%   BMI 37.34 kg/m      Wt Readings from Last 3 Encounters:  07/19/24 283 lb (128.4 kg)  06/20/24 284 lb 12.8 oz (129.2 kg)  05/27/24 280 lb (127 kg)    GEN: Well nourished, well developed in no acute distress NECK: No JVD; No carotid bruits CARDIAC: irregular rhythm, regular rate, no murmurs, rubs, gallops RESPIRATORY:  Clear to auscultation without rales, wheezing or rhonchi  ABDOMEN: Soft, non-tender, non-distended EXTREMITIES:  No edema; No deformity    ASSESSMENT AND PLAN Larry Hammond is a 53 y.o. male with persistent AF/RVR, HTN, DM2, OSA, and  cataracts who presents for evaluation of AF management.    Symptomatic Persistent AF Plan for de novo PVI+PWI for persistent AF. Will likely continue amio for 30 days post procedure.  We reviewed different options for management of his AF including rhythm control versus ablation.  His QTc is reasonable for dofetilide.  He would preference proceeding with catheter ablation to reduce his AF burden.  He is scheduled for repeat cardioversion on Monday after several weeks of amiodarone .  His last cardioversion only lasted a few days.   Hopefully he will maintain sinus rhythm leading up to ablation.    Discussed treatment options today for AF including antiarrhythmic drug therapy and ablation. Discussed risks, recovery and likelihood of success with each treatment strategy. Risk, benefits, and alternatives to EP study and ablation for afib were discussed. These risks include but are not limited to stroke, bleeding, vascular damage, tamponade, perforation, damage to the esophagus, lungs, phrenic nerve and other structures, pulmonary vein stenosis, worsening renal function, coronary vasospasm and death.  Discussed potential need for repeat ablation procedures and antiarrhythmic drugs after an initial ablation. The patient understands these risk and wishes to proceed.  We will therefore proceed with catheter ablation on Friday, 08/23/24. Carto, ICE, anesthesia are requested for the procedure.     Pre procedure details: ICE/Carto/Varipulse. No CT scan needed. Apixaban  hold 08/23/24 AM dose, last dose to give 10/23 PM. Hold Ozempic dose the week prior. Continue all meds the day prior. Hold all meds the morning of procedure. Plan for same day discharge.    Signed, [name], MD

## 2024-08-23 NOTE — Transfer of Care (Signed)
 Immediate Anesthesia Transfer of Care Note  Patient: Larry Hammond  Procedure(s) Performed: ATRIAL FIBRILLATION ABLATION  Patient Location: PACU  Anesthesia Type:General  Level of Consciousness: awake, alert , and oriented  Airway & Oxygen Therapy: Patient Spontanous Breathing and Patient connected to nasal cannula oxygen  Post-op Assessment: Report given to RN and Post -op Vital signs reviewed and stable  Post vital signs: Reviewed and stable  Hammond Vitals:  Vitals Value Taken Time  BP 161/78 08/23/24 13:32  Temp 98   Pulse 69 08/23/24 13:36  Resp 16 08/23/24 13:36  SpO2 92 % 08/23/24 13:36  Vitals shown include unfiled device data.  Hammond Pain:  Vitals:   08/23/24 0909  TempSrc: Oral  PainSc:       Patients Stated Pain Goal: 4 (08/23/24 0841)  Complications: No notable events documented.

## 2024-08-23 NOTE — Anesthesia Procedure Notes (Signed)
 Procedure Name: Intubation Date/Time: 08/23/2024 11:22 AM  Performed by: Scherrie Mast, CRNAPre-anesthesia Checklist: Patient identified, Emergency Drugs available, Suction available and Patient being monitored Patient Re-evaluated:Patient Re-evaluated prior to induction Oxygen Delivery Method: Circle System Utilized Preoxygenation: Pre-oxygenation with 100% oxygen Induction Type: IV induction Ventilation: Mask ventilation without difficulty Laryngoscope Size: Mac and 4 Grade View: Grade I Tube type: Oral Tube size: 7.0 mm Number of attempts: 1 Airway Equipment and Method: Stylet and Oral airway Placement Confirmation: ETT inserted through vocal cords under direct vision, positive ETCO2 and breath sounds checked- equal and bilateral Secured at: 21 cm Tube secured with: Tape Dental Injury: Teeth and Oropharynx as per pre-operative assessment

## 2024-08-23 NOTE — Op Note (Addendum)
   Procedure:  Intracardiac catheter ablation AFIB including Transseptal Cath (CPT 93656)  Pre-Op Diagnosis: persistent AF  Post-Op Diagnosis: same   Procedure Date:  08/23/24   Attending: Adina Primus, MD   Anesthesia: general anesthesia   Initial Intervals: NSR, PR 184, QRS 98, QT 370, RR 855  Procedure: The patient entered the EP lab in a fasting nonsedated state. The procedural time-out was achieved. The patient was placed under general anesthesia by [facility]. Once the patient was draped and prepped in normal fashion with multiple layers of Hibiclens scrub in the bilateral groins, access to the veins was achieved under ultrasound guidance using the modified Seldinger technique. Following access both sites were pre closed with Perclose ProStyle suture mediated closure.    Access/sheath: - RFV: 8 Fr short sheath->8.5 Fr Sm curl Vizigo - RFV: 9 Fr short sheath   Catheters: - 8.5Fr SoundStar ICE - Varipulse  PFA catheter - OctaRay 2-2-2-2-2 mapping catheter  - Webster CS D/F decapolar catheter  Transseptal Access Systemic heparinization was given to maintain ACT's between >350. Transseptal puncture was performed with the VersaCross wire through the Vizigo sheath using ICE and fluoroscopy. The sheath was carefully aspirated and flushed.      Mapping: Next, a deflectable OctaRay was advanced into the left atrium through the Vizigo sheath.  A 3-dimensional electroanatomic map was constructed of the left atrium and pulmonary veins using the Carto mapping system.     Ablation: The OctaRay was removed from the Vizigo sheath and the Carto Varipulse PFA catheter was advanced to the LSPV os. The PFA catheter was advanced to each of the 4 pulmonary veins and at least 4 PFA applications were applied per vein. Additional lesions were applied across both carinas and the RPV septum. Following initial ablation, repeat mapping with the Varipulse catheter confirmed first pass isolation  in all 4 veins.    Heparinization was then reversed with protamine. Catheters and sheaths were pulled and hemostasis obtained with Perclose ProStyle sutures. No complications were evident. Final ICE assessment without pericardial effusion. The patient was transported to the CVSSU for recovery.  Final intervals: NSR, PR 170, QRS 110, QT 450, RR 998   Summary: NSR on arrival   Successful transseptal puncture x 1 with ICE guidance  3-D mapping of the LA and PV's   Successful PV isolation ([facility]) using J&J Varipulse PFA    Recommendations: Bedrest x 2 hours  Anti-coagulation: Resume apixaban  5 mg bid at 1430, additional dose tonight Anti-platelet: none Anti-arrhythmic: continue amiodarone  200 mg daily x 30 days then discontinue  Rate control: continue OP metoprolol  tartrate 50 mg bid, LVEF 45-50%, can d/c if no recurrent arrhythmias and LVEF recovers on repeat TTE scheduled  EP f/u to be scheduled Same day discharge    [name], MD [facility]  Cardiac Electrophysiology

## 2024-08-23 NOTE — Interval H&P Note (Signed)
 History and Physical Interval Note:  08/23/2024 10:33 AM  Larry Hammond  has presented today for surgery, with the diagnosis of Afib.  The various methods of treatment have been discussed with the patient and family. After consideration of risks, benefits and other options for treatment, the patient has consented to  Procedure(s): ATRIAL FIBRILLATION ABLATION (N/A) as a surgical intervention.  The patient's history has been reviewed, patient examined, no change in status, stable for surgery.  I have reviewed the patient's chart and labs.  Questions were answered to the patient's satisfaction.    LD apixaban  10/23 PM. No missed doses. Amio. Continue with planned PVI. Same day discharge.   Larry Hammond

## 2024-08-24 ENCOUNTER — Encounter (HOSPITAL_COMMUNITY): Payer: Self-pay | Admitting: Student in an Organized Health Care Education/Training Program

## 2024-08-26 ENCOUNTER — Telehealth (HOSPITAL_COMMUNITY): Payer: Self-pay

## 2024-08-26 ENCOUNTER — Encounter: Payer: Self-pay | Admitting: Emergency Medicine

## 2024-08-26 MED FILL — Fentanyl Citrate Preservative Free (PF) Inj 100 MCG/2ML: INTRAMUSCULAR | Qty: 2 | Status: AC

## 2024-08-26 NOTE — Telephone Encounter (Signed)
 Attempted to reach patient to follow up with procedure completed on 08/23/24, no answer. Unable to leave VM.

## 2024-09-02 ENCOUNTER — Other Ambulatory Visit: Payer: Self-pay | Admitting: Internal Medicine

## 2024-09-04 NOTE — Progress Notes (Unsigned)
 Patient ID: Larry Hammond, male   DOB: December 06, 1970, 53 y.o.   MRN: [id number]  Reason for Consult: No chief complaint on file.   Referred by Evangelina Tinnie Brier*  Subjective:     HPI Larry Hammond is a 53 y.o. male presenting for evaluation of PAD.  Past Medical History:  Diagnosis Date   Anxiety    Chronic back pain    DDD (degenerative disc disease), lumbar    Depression    Diabetes mellitus without complication (HCC)    Hyperlipemia    Hypertension    No family history on file. Past Surgical History:  Procedure Laterality Date   ATRIAL FIBRILLATION ABLATION N/A 08/23/2024   Procedure: ATRIAL FIBRILLATION ABLATION;  Surgeon: Almetta Donnice LABOR, MD;  Location: [facility] INVASIVE CV LAB;  Service: Cardiovascular;  Laterality: N/A;   CARDIOVERSION N/A 05/27/2024   Procedure: CARDIOVERSION;  Surgeon: Michele Richardson, DO;  Location: [facility] INVASIVE CV LAB;  Service: Cardiovascular;  Laterality: N/A;   CARDIOVERSION N/A 07/22/2024   Procedure: CARDIOVERSION;  Surgeon: Kate Lonni CROME, MD;  Location[facility] INVASIVE CV LAB;  Service: Cardiovascular;  Laterality: N/A;   HERNIA REPAIR     MEDIAL COLLATERAL LIGAMENT REPAIR, KNEE      Short Social History:  Social History   Tobacco Use   Smoking status: Never   Smokeless tobacco: Former   Tobacco comments:    Former chew 06/20/24  Substance Use Topics   Alcohol use: Yes    No Known Allergies  Current Outpatient Medications  Medication Sig Dispense Refill   amiodarone  (PACERONE ) 200 MG tablet Take 1 tablet (200 mg total) by mouth 2 (two) times daily for 30 days, THEN 1 tablet (200 mg total) daily. (Patient taking differently: Take 1 tablet (200 mg total) daily.) 60 tablet 3   apixaban  (ELIQUIS ) 5 MG TABS tablet Take 1 tablet (5 mg total) by mouth 2 (two) times daily. 60 tablet 3   buPROPion (WELLBUTRIN XL) 300 MG 24 hr tablet Take 300 mg by mouth daily.     diclofenac (VOLTAREN) 75 MG EC tablet Take 75 mg by mouth 2 (two) times  daily.     DULoxetine (CYMBALTA) 30 MG capsule Take 30 mg by mouth daily.     DULoxetine (CYMBALTA) 60 MG capsule Take 60 mg by mouth daily.     lisinopril-hydrochlorothiazide (ZESTORETIC) 20-25 MG tablet Take 1 tablet by mouth daily.     Magnesium  400 MG CAPS Take 400 mg by mouth daily. 30 capsule 2   metFORMIN (GLUCOPHAGE) 1000 MG tablet Take 1,000 mg by mouth 2 (two) times daily with a meal.     metoprolol  tartrate (LOPRESSOR ) 50 MG tablet TAKE 1 TABLET (50 MG TOTAL) BY MOUTH 2 (TWO) TIMES DAILY. HOLD IF SYSTOLIC BLOOD PRESSURE (TOP NUMBER) LESS THAN 100 MMHG OR PULSE LESS THAN 55 BPM 180 tablet 3   omeprazole (PRILOSEC OTC) 20 MG tablet Take 40 mg by mouth daily.     OZEMPIC, 1 MG/DOSE, 4 MG/3ML SOPN Inject 1 mg into the skin once a week.     pregabalin (LYRICA) 150 MG capsule Take 150 mg by mouth 2 (two) times daily.     rosuvastatin (CRESTOR) 20 MG tablet Take 20 mg by mouth daily.     TRESIBA FLEXTOUCH 200 UNIT/ML FlexTouch Pen Inject 100 Units into the skin every morning.     No current facility-administered medications for this visit.    REVIEW OF SYSTEMS  All other systems were reviewed and are negative  Objective:  Objective   There were no vitals filed for this visit. There is no height or weight on file to calculate BMI.  Physical Exam General: no acute distress Cardiac: hemodynamically stable Abdomen: non-tender, no pulsatile mass*** Extremities: no edema, cyanosis or wounds*** Vascular:   Right: ***  Left: ***  Data: ABI ***  BMP reviewed, creatinine 0.86  A1c reviewed, 11.1     Assessment/Plan:   Larry Hammond is a 53 y.o. male with ***  Recommendations to optimize cardiovascular risk: Abstinence from all tobacco products. Blood glucose control with goal A1c < 7%. Blood pressure control with goal blood pressure < 140/90 mmHg. Lipid reduction therapy with goal LDL-C <55 mg/dL  Aspirin 81mg  PO QD.  Atorvastatin 40-80mg  PO QD (or other high  intensity statin therapy).   Larry GORMAN Serve MD [facility]

## 2024-09-06 ENCOUNTER — Ambulatory Visit: Admitting: Vascular Surgery

## 2024-09-06 ENCOUNTER — Ambulatory Visit (HOSPITAL_COMMUNITY)
Admission: RE | Admit: 2024-09-06 | Discharge: 2024-09-06 | Disposition: A | Discharge status: Home / Self Care | Source: Ambulatory Visit | Attending: Vascular Surgery | Admitting: Vascular Surgery

## 2024-09-06 ENCOUNTER — Encounter: Payer: Self-pay | Admitting: Vascular Surgery

## 2024-09-06 ENCOUNTER — Encounter (HOSPITAL_COMMUNITY)

## 2024-09-06 ENCOUNTER — Encounter: Admitting: Vascular Surgery

## 2024-09-06 VITALS — BP 134/81 | HR 78 | Temp 98.0°F | Resp 20 | Ht 73.0 in | Wt 283.0 lb

## 2024-09-06 DIAGNOSIS — I739 Peripheral vascular disease, unspecified: Secondary | ICD-10-CM | POA: Insufficient documentation

## 2024-09-06 DIAGNOSIS — M51362 Other intervertebral disc degeneration, lumbar region with discogenic back pain and lower extremity pain: Secondary | ICD-10-CM | POA: Diagnosis present

## 2024-09-06 LAB — VAS US ABI WITH/WO TBI
Left ABI: 0.96
Right ABI: 0.97

## 2024-09-11 ENCOUNTER — Other Ambulatory Visit (HOSPITAL_COMMUNITY): Payer: Self-pay | Admitting: Internal Medicine

## 2024-09-19 ENCOUNTER — Encounter: Admitting: Vascular Surgery

## 2024-09-19 ENCOUNTER — Encounter (HOSPITAL_COMMUNITY): Payer: Self-pay | Admitting: Internal Medicine

## 2024-09-19 ENCOUNTER — Ambulatory Visit (HOSPITAL_COMMUNITY)
Admission: RE | Admit: 2024-09-19 | Discharge: 2024-09-19 | Disposition: A | Discharge status: Home / Self Care | Source: Ambulatory Visit | Attending: Internal Medicine | Admitting: Internal Medicine

## 2024-09-19 ENCOUNTER — Ambulatory Visit (HOSPITAL_COMMUNITY)

## 2024-09-19 VITALS — BP 140/90 | HR 80 | Ht 73.0 in | Wt 279.4 lb

## 2024-09-19 DIAGNOSIS — I4819 Other persistent atrial fibrillation: Secondary | ICD-10-CM

## 2024-09-19 DIAGNOSIS — D6869 Other thrombophilia: Secondary | ICD-10-CM

## 2024-09-19 DIAGNOSIS — Z5181 Encounter for therapeutic drug level monitoring: Secondary | ICD-10-CM

## 2024-09-19 NOTE — Patient Instructions (Signed)
 Stop amiodarone

## 2024-09-19 NOTE — Progress Notes (Signed)
 Primary Care Physician: Evangelina Tinnie Norris, PA-C Primary Cardiologist: Madonna Large, DO Electrophysiologist: None     Referring Physician: Dr. Large Norleen Last is a 53 y.o. male with a history of HTN, T2DM, OSA, and cataract who presents for consultation in the [facility]. ED visit on 05/02/24 for new Afib with RVR found at scheduled outpatient cataract surgery. Prescribed Lopressor  25 mg BID. Patient is on Eliquis  5 mg BID for a CHADS2VASC score of 2. S/p successful DCCV on 7/28. Echo performed on 7/29 showed mildly decreased LV systolic function.   On follow up 09/19/24, patient is currently in NSR. S/p Afib ablation on 08/23/24 by Dr. Almetta. No episodes of Afib since ablation. He is taking amiodarone  200 mg daily and instructed to stop 30 days after ablation. He ran out of amiodarone  2 days ago. No chest pain or SOB. Leg sites healed without issue. No missed doses of anticoagulant.  Today, he denies symptoms of orthopnea, PND, lower extremity edema, dizziness, presyncope, syncope, snoring, daytime somnolence, bleeding, or neurologic sequela. The patient is tolerating medications without difficulties and is otherwise without complaint today.    Atrial Fibrillation Risk Factors:  he does have symptoms or diagnosis of sleep apnea. he is compliant with CPAP therapy.   he has a BMI of Body mass index is 36.86 kg/m.SABRA Filed Weights   09/19/24 0839  Weight: 126.7 kg      Current Outpatient Medications  Medication Sig Dispense Refill   apixaban  (ELIQUIS ) 5 MG TABS tablet Take 1 tablet (5 mg total) by mouth 2 (two) times daily. 60 tablet 3   buPROPion (WELLBUTRIN XL) 300 MG 24 hr tablet Take 300 mg by mouth daily.     diclofenac (VOLTAREN) 75 MG EC tablet Take 75 mg by mouth 2 (two) times daily.     DULoxetine (CYMBALTA) 30 MG capsule Take 30 mg by mouth daily.     DULoxetine (CYMBALTA) 60 MG capsule Take 60 mg by mouth daily.      lisinopril-hydrochlorothiazide (ZESTORETIC) 20-25 MG tablet Take 1 tablet by mouth daily.     Magnesium  400 MG CAPS Take 400 mg by mouth daily. 30 capsule 2   metFORMIN (GLUCOPHAGE) 1000 MG tablet Take 1,000 mg by mouth 2 (two) times daily with a meal.     metoprolol  tartrate (LOPRESSOR ) 50 MG tablet TAKE 1 TABLET (50 MG TOTAL) BY MOUTH 2 (TWO) TIMES DAILY. HOLD IF SYSTOLIC BLOOD PRESSURE (TOP NUMBER) LESS THAN 100 MMHG OR PULSE LESS THAN 55 BPM 180 tablet 3   omeprazole (PRILOSEC OTC) 20 MG tablet Take 40 mg by mouth daily.     OZEMPIC, 1 MG/DOSE, 4 MG/3ML SOPN Inject 1 mg into the skin once a week.     pregabalin (LYRICA) 150 MG capsule Take 150 mg by mouth 2 (two) times daily.     rosuvastatin (CRESTOR) 20 MG tablet Take 20 mg by mouth daily.     TRESIBA FLEXTOUCH 200 UNIT/ML FlexTouch Pen Inject 100 Units into the skin every morning.     No current facility-administered medications for this encounter.    Atrial Fibrillation Management history:  Previous antiarrhythmic drugs: none Previous cardioversions: 05/27/24 Previous ablations: 08/23/24 Anticoagulation history: Eliquis    ROS- All systems are reviewed and negative except as per the HPI above.  Physical Exam: BP (!) 140/90   Pulse 80   Ht 6' 1 (1.854 m)   Wt 126.7 kg   BMI 36.86 kg/m  GEN- The patient is well appearing, alert and oriented x 3 today.   Neck - no JVD or carotid bruit noted Lungs- Clear to ausculation bilaterally, normal work of breathing Heart- Regular rate and rhythm, no murmurs, rubs or gallops, PMI not laterally displaced Extremities- no clubbing, cyanosis, or edema Skin - no rash or ecchymosis noted   EKG today demonstrates  Vent. rate 80 BPM PR interval 184 ms QRS duration 96 ms QT/QTcB 378/435 ms P-R-T axes 34 42 46 Normal sinus rhythm Normal ECG When compared with ECG of 23-Aug-2024 13:52, Previous ECG is present  Echo 05/28/24: 1. Left ventricular ejection fraction, by estimation, is 45  to 50%. The  left ventricle has mildly decreased function. The left ventricle has no  regional wall motion abnormalities. There is mild concentric left  ventricular hypertrophy. Left ventricular  diastolic function could not be evaluated.   2. Right ventricular systolic function is mildly reduced. The right  ventricular size is normal.   3. Left atrial size was mild to moderately dilated.   4. Right atrial size was moderately dilated.   5. The mitral valve is normal in structure. Mild mitral valve  regurgitation. No evidence of mitral stenosis.   6. The aortic valve is tricuspid. There is mild calcification of the  aortic valve. Aortic valve regurgitation is not visualized. No aortic  stenosis is present.   7. Aortic dilatation noted. There is borderline dilatation of the aortic  root, measuring 38 mm.   8. The inferior vena cava is normal in size with <50% respiratory  variability, suggesting right atrial pressure of 8 mmHg.   ASSESSMENT & PLAN CHA2DS2-VASc Score = 2  The patient's score is based upon: CHF History: 0 HTN History: 1 Diabetes History: 1 Stroke History: 0 Vascular Disease History: 0 Age Score: 0 Gender Score: 0       ASSESSMENT AND PLAN: Persistent Atrial Fibrillation (ICD10:  I48.19) The patient's CHA2DS2-VASc score is 2, indicating a 2.2% annual risk of stroke.   S/p successful DCCV on 05/27/24. S/p Afib ablation on 08/23/24 by Dr. Almetta.  Patient is currently in NSR. He ran out of amiodarone  a couple of days ago so do not restart at this time.   Secondary Hypercoagulable State (ICD10:  D68.69) The patient is at significant risk for stroke/thromboembolism based upon his CHA2DS2-VASc Score of 2.  Continue Apixaban  (Eliquis ).  No missed doses.     Follow up with EP as scheduled.    Terra Pac, PA-C  [facility] [address] [place], [place] [address] [phone]

## 2024-09-20 ENCOUNTER — Ambulatory Visit (HOSPITAL_COMMUNITY): Admitting: Internal Medicine

## 2024-09-24 ENCOUNTER — Other Ambulatory Visit (HOSPITAL_COMMUNITY): Payer: Self-pay | Admitting: Internal Medicine

## 2024-09-24 NOTE — Telephone Encounter (Signed)
 Prescription refill request for Eliquis  received. Indication:afib Last office visit:11/25 Scr: 0.87  10/25 Age:53 Weight:126.7  kg  Prescription refilled

## 2024-10-07 ENCOUNTER — Ambulatory Visit: Admitting: Physical Medicine and Rehabilitation

## 2024-11-22 ENCOUNTER — Ambulatory Visit: Admitting: Pulmonary Disease

## 2024-12-06 ENCOUNTER — Ambulatory Visit (HOSPITAL_COMMUNITY): Admission: RE | Admit: 2024-12-06

## 2024-12-06 DIAGNOSIS — I4819 Other persistent atrial fibrillation: Secondary | ICD-10-CM

## 2024-12-06 LAB — ECHOCARDIOGRAM COMPLETE
Area-P 1/2: 3.83 cm2
S' Lateral: 3.2 cm

## 2024-12-06 MED ORDER — PERFLUTREN LIPID MICROSPHERE
1.0000 mL | INTRAVENOUS | Status: AC | PRN
  Administered 2024-12-06: 2 mL via INTRAVENOUS

## 2024-12-17 ENCOUNTER — Ambulatory Visit: Admitting: Pulmonary Disease
